# Patient Record
Sex: Female | Born: 1985 | Race: Black or African American | Hispanic: No | Marital: Married | Smoking: Never smoker
Health system: Southern US, Community
[De-identification: ages and names within clinical notes are randomized; demographics above are authoritative.]

## PROBLEM LIST (undated history)

## (undated) DIAGNOSIS — O09523 Supervision of elderly multigravida, third trimester: Secondary | ICD-10-CM

## (undated) DIAGNOSIS — Z789 Other specified health status: Secondary | ICD-10-CM

## (undated) DIAGNOSIS — J45909 Unspecified asthma, uncomplicated: Secondary | ICD-10-CM

## (undated) HISTORY — DX: Other specified health status: Z78.9

## (undated) HISTORY — PX: DILATION AND CURETTAGE OF UTERUS: SHX78

---

## 2016-03-16 ENCOUNTER — Ambulatory Visit
Admission: EM | Admit: 2016-03-16 | Discharge: 2016-03-16 | Disposition: A | Discharge status: Home / Self Care | Payer: BLUE CROSS/BLUE SHIELD

## 2016-03-16 DIAGNOSIS — N939 Abnormal uterine and vaginal bleeding, unspecified: Secondary | ICD-10-CM

## 2016-03-16 NOTE — ED Provider Notes (Signed)
Patient presents today with symptoms of spotting. Patient states that she noticed this earlier today. She is approximately [redacted] weeks pregnant. She denies any fever, chills, abdominal pain, nausea, vomiting. She does not have an appointment with her OB/GYN until early May. I discussed with the patient that there is no ability for us to do an ultrasound here in the urgent care today. She would have to go to the ER if she were to get this. Patient addresses understanding and will decide if she wants to go to the ER.  Jolene ProvostKirtida Pelagia Iacobucci, MD 03/16/16 [phone]

## 2019-12-08 ENCOUNTER — Ambulatory Visit: Payer: Commercial Managed Care - PPO | Attending: Internal Medicine

## 2019-12-08 DIAGNOSIS — Z20822 Contact with and (suspected) exposure to covid-19: Secondary | ICD-10-CM | POA: Insufficient documentation

## 2019-12-10 LAB — NOVEL CORONAVIRUS, NAA: SARS-CoV-2, NAA: NOT DETECTED

## 2019-12-26 LAB — OB RESULTS CONSOLE GC/CHLAMYDIA
Chlamydia: NEGATIVE
Gonorrhea: NEGATIVE

## 2019-12-26 LAB — OB RESULTS CONSOLE RPR: RPR: NONREACTIVE

## 2019-12-26 LAB — OB RESULTS CONSOLE HEPATITIS B SURFACE ANTIGEN: Hepatitis B Surface Ag: NEGATIVE

## 2019-12-26 LAB — OB RESULTS CONSOLE RUBELLA ANTIBODY, IGM: Rubella: IMMUNE

## 2019-12-26 LAB — OB RESULTS CONSOLE HIV ANTIBODY (ROUTINE TESTING): HIV: NONREACTIVE

## 2019-12-26 LAB — OB RESULTS CONSOLE ABO/RH: RH Type: POSITIVE

## 2019-12-26 LAB — OB RESULTS CONSOLE ANTIBODY SCREEN: Antibody Screen: NEGATIVE

## 2020-03-23 ENCOUNTER — Encounter: Payer: Self-pay | Admitting: Obstetrics and Gynecology

## 2020-03-28 ENCOUNTER — Other Ambulatory Visit (HOSPITAL_COMMUNITY): Payer: Self-pay | Admitting: Obstetrics and Gynecology

## 2020-03-28 DIAGNOSIS — Z363 Encounter for antenatal screening for malformations: Secondary | ICD-10-CM

## 2020-04-18 ENCOUNTER — Encounter: Payer: Self-pay | Admitting: *Deleted

## 2020-04-20 ENCOUNTER — Ambulatory Visit (HOSPITAL_COMMUNITY): Payer: Commercial Managed Care - PPO | Attending: Obstetrics and Gynecology

## 2020-04-20 ENCOUNTER — Other Ambulatory Visit: Payer: Self-pay | Admitting: *Deleted

## 2020-04-20 ENCOUNTER — Encounter: Payer: Self-pay | Admitting: Obstetrics and Gynecology

## 2020-04-20 ENCOUNTER — Other Ambulatory Visit: Payer: Self-pay

## 2020-04-20 ENCOUNTER — Other Ambulatory Visit (HOSPITAL_COMMUNITY): Payer: Self-pay | Admitting: Obstetrics and Gynecology

## 2020-04-20 ENCOUNTER — Ambulatory Visit: Payer: Commercial Managed Care - PPO | Admitting: *Deleted

## 2020-04-20 ENCOUNTER — Ambulatory Visit (HOSPITAL_BASED_OUTPATIENT_CLINIC_OR_DEPARTMENT_OTHER): Payer: Commercial Managed Care - PPO | Admitting: Obstetrics and Gynecology

## 2020-04-20 VITALS — BP 121/83 | HR 124 | Ht 67.0 in

## 2020-04-20 DIAGNOSIS — O0990 Supervision of high risk pregnancy, unspecified, unspecified trimester: Secondary | ICD-10-CM | POA: Diagnosis present

## 2020-04-20 DIAGNOSIS — Z363 Encounter for antenatal screening for malformations: Secondary | ICD-10-CM | POA: Insufficient documentation

## 2020-04-20 DIAGNOSIS — O403XX1 Polyhydramnios, third trimester, fetus 1: Secondary | ICD-10-CM | POA: Diagnosis present

## 2020-04-20 DIAGNOSIS — O321XX2 Maternal care for breech presentation, fetus 2: Secondary | ICD-10-CM

## 2020-04-20 DIAGNOSIS — O09523 Supervision of elderly multigravida, third trimester: Secondary | ICD-10-CM | POA: Diagnosis not present

## 2020-04-20 DIAGNOSIS — E669 Obesity, unspecified: Secondary | ICD-10-CM

## 2020-04-20 DIAGNOSIS — Z3A28 28 weeks gestation of pregnancy: Secondary | ICD-10-CM

## 2020-04-20 DIAGNOSIS — O30043 Twin pregnancy, dichorionic/diamniotic, third trimester: Secondary | ICD-10-CM

## 2020-04-20 DIAGNOSIS — O34219 Maternal care for unspecified type scar from previous cesarean delivery: Secondary | ICD-10-CM

## 2020-04-20 DIAGNOSIS — O365932 Maternal care for other known or suspected poor fetal growth, third trimester, fetus 2: Secondary | ICD-10-CM | POA: Insufficient documentation

## 2020-04-20 DIAGNOSIS — O30049 Twin pregnancy, dichorionic/diamniotic, unspecified trimester: Secondary | ICD-10-CM

## 2020-04-20 DIAGNOSIS — O321XX0 Maternal care for breech presentation, not applicable or unspecified: Secondary | ICD-10-CM | POA: Diagnosis not present

## 2020-04-20 NOTE — Progress Notes (Signed)
Maternal-Fetal Medicine   Name: Joanne Merritt DOB: Apr 25, 1986 MRN: [id number] Referring Provider: Doristine Counter, MD   I had the pleasure of seeing [facility] Merr[facility] at [facility]. She is a G5 P1031 with dichorionic-diamniotic twin pregnancy at 28w 3d gestation and on ultrasound performed at your office selective fetal growth restriction of twin B was detected.  Her prenatal care has been, otherwise, uneventful.  She is yet to undergo screening for gestational diabetes.  Patient reports she had midtrimester fetal anatomy scan that was reported as normal.  She had opted not to screen for fetal aneuploidies. Obstetric history significant for a term cesarean delivery (nonreassuring fetal heart trace) in 2018 at [facility]  She delivered a female infant weighing 8 pounds at birth and her son is in good health.  She also had early miscarriages. GYN history: No history of abnormal Pap smears or cervical surgeries. No history of breast disease.  Her previous cycles were reportedly regular. Past medical history: Childhood asthma.  No history of diabetes or hypertension or sickle cell trait. Past surgical history: D&C, cesarean section. Medications: Prenatal vitamins, vitamin D. Allergies: Penicillin (hives). Social history: Denies tobacco drug or alcohol use.  Her partner is African-American and he is in good health he is also the father of her first child. Family history: No history of venous thromboembolism in the family.  On ultrasound, we confirmed dichorionic-diamniotic twin pregnancy. Twin A: Maternal left, cephalic presentation, anterior placenta, female fetus.  Mild polyhydramnios is seen (maximum vertical pocket measuring 10.4 cm).  Fetal growth is appropriate for gestational age.  The estimated fetal weight is at the 31st percentile.  Fetal anatomical survey appears normal but limited by advanced gestational age.  Umbilical artery Doppler showed slightly increased  S/D ratio.  Twin B: Maternal right, breech presentation, anterio lateral placenta, female fetus.  Amniotic fluid is normal and good fetal activity seen.  The estimated fetal weight is at less than the 1st percentile.  Fetal anatomical survey appears normal but limited by advanced gestational age.  Umbilical artery Doppler showed normal forward diastolic flow.  Growth discordance: 40% (abnormal).  I counseled the patient on the following: Dichorionic-diamniotic twin pregnancy with selective fetal growth restriction (twin B) -Explained the chorionicity with diagrams and the usually favorable outcome of one or both twins and dichorionic twin gestation. -Patient has selective severe fetal growth restriction of twin B. -Discussed the possible causes of fetal growth restriction including placental insufficiency (most-common cause), fetal infections (unlikely), fetal chromosomal anomalies or genetic conditions. -Discussed the significance and frequency of antenatal testing to detect fetal compromise and plan delivery.  Weekly antenatal testing is recommended from now till delivery. -I informed her that only amniocentesis will be able to determine the fetal karyotype or genetic conditions Doctor, general practice).  I explained amniocentesis procedure and possible complications including preterm delivery or PPROM in 1 and 500 procedures.  Patient opted not to have amniocentesis. -Our goal is to prolong the pregnancy with frequent antenatal testing. -Timing of delivery would be based on interval growth and antenatal testing. -If good interval growth is seen in twin B we would recommend delivery at [redacted] weeks gestation provided antenatal testing remains reassuring.  However, if poor interval growth is seen or if antenatal testing is abnormal earlier delivery would be recommended. -Patient understands that preterm delivery of twins for growth restriction in one twin places the other healthy twin at risk of prematurity  complications. -I informed her fetal growth restriction may proceed  development of preeclampsia in some cases.  Blood pressure today at our office was 121/83 mmHg. -I discussed the role of antenatal corticosteroids for fetal pulmonary maturity that are preferably given when delivery is anticipated.  Recommendations: -Weekly umbilical artery Doppler to continue. -NST with Doppler studies -Fetal growth assessment in 3 weeks. -BPP from 31 to [redacted] weeks gestation. -Delivery at [redacted] weeks gestation or earlier as indicated. -Gestational diabetes screening now as antenatal corticosteroids may be indicated.  Thank you for your consultation if you have any questions or concerns please do not hesitate to contact me at the [facility]. Consultation including face-to-face counseling: 40 minutes.

## 2020-04-25 ENCOUNTER — Other Ambulatory Visit: Payer: Commercial Managed Care - PPO

## 2020-04-26 ENCOUNTER — Other Ambulatory Visit: Payer: Commercial Managed Care - PPO

## 2020-04-27 ENCOUNTER — Ambulatory Visit: Payer: Commercial Managed Care - PPO | Attending: Obstetrics and Gynecology

## 2020-04-27 ENCOUNTER — Ambulatory Visit: Payer: Commercial Managed Care - PPO | Admitting: *Deleted

## 2020-04-27 ENCOUNTER — Other Ambulatory Visit: Payer: Self-pay

## 2020-04-27 ENCOUNTER — Encounter: Payer: Self-pay | Admitting: *Deleted

## 2020-04-27 VITALS — BP 137/86 | HR 105

## 2020-04-27 DIAGNOSIS — O30043 Twin pregnancy, dichorionic/diamniotic, third trimester: Secondary | ICD-10-CM | POA: Diagnosis not present

## 2020-04-27 DIAGNOSIS — O99213 Obesity complicating pregnancy, third trimester: Secondary | ICD-10-CM

## 2020-04-27 DIAGNOSIS — O30049 Twin pregnancy, dichorionic/diamniotic, unspecified trimester: Secondary | ICD-10-CM | POA: Insufficient documentation

## 2020-04-27 DIAGNOSIS — O09523 Supervision of elderly multigravida, third trimester: Secondary | ICD-10-CM | POA: Diagnosis not present

## 2020-04-27 DIAGNOSIS — O403XX1 Polyhydramnios, third trimester, fetus 1: Secondary | ICD-10-CM

## 2020-04-27 DIAGNOSIS — O365932 Maternal care for other known or suspected poor fetal growth, third trimester, fetus 2: Secondary | ICD-10-CM | POA: Diagnosis not present

## 2020-04-27 DIAGNOSIS — Z363 Encounter for antenatal screening for malformations: Secondary | ICD-10-CM

## 2020-04-27 DIAGNOSIS — Z3A29 29 weeks gestation of pregnancy: Secondary | ICD-10-CM

## 2020-04-27 DIAGNOSIS — O34219 Maternal care for unspecified type scar from previous cesarean delivery: Secondary | ICD-10-CM | POA: Diagnosis not present

## 2020-04-27 DIAGNOSIS — E669 Obesity, unspecified: Secondary | ICD-10-CM

## 2020-04-27 NOTE — Procedures (Signed)
Joanne Merritt 04-29-1986 [redacted]w[redacted]d   Fetus B Non-Stress Test Interpretation for 04/27/20  Indication: IUGR Joanne Merritt 01-Jun-1986 [redacted]w[redacted]d  Fetus A Non-Stress Test Interpretation for 04/27/20  Indication: Di/Di twins, IUGR in Twin B  Fetal Heart Rate A Mode: External Baseline Rate (A): 150 bpm Variability: Moderate Accelerations: 10 x 10 Decelerations: None Multiple birth?: Yes  Uterine Activity Mode: Palpation, Toco Contraction Frequency (min): Occas. Contraction Quality: Mild Resting Tone Palpated: Relaxed Resting Time: Adequate  Interpretation (Fetal Testing) Nonstress Test Interpretation: Reactive Overall Impression: Reassuring for gestational age Comments: Tracing reviewed by Dr. Judeth Cornfield    Fetal Heart Rate Fetus B Mode: External Baseline Rate (B): 155 BPM Variability: Moderate Accelerations: 10 x 10 Decelerations: None  Uterine Activity Mode: Palpation, Toco Contraction Frequency (min): Occas. Contraction Quality: Mild Resting Tone Palpated: Relaxed Resting Time: Adequate

## 2020-05-02 ENCOUNTER — Encounter: Payer: Self-pay | Admitting: Obstetrics and Gynecology

## 2020-05-04 ENCOUNTER — Ambulatory Visit: Payer: Commercial Managed Care - PPO | Attending: Obstetrics and Gynecology

## 2020-05-04 ENCOUNTER — Ambulatory Visit (HOSPITAL_BASED_OUTPATIENT_CLINIC_OR_DEPARTMENT_OTHER): Payer: Commercial Managed Care - PPO | Admitting: *Deleted

## 2020-05-04 ENCOUNTER — Ambulatory Visit: Payer: Commercial Managed Care - PPO | Admitting: *Deleted

## 2020-05-04 ENCOUNTER — Other Ambulatory Visit: Payer: Self-pay

## 2020-05-04 VITALS — BP 127/83 | HR 104

## 2020-05-04 DIAGNOSIS — O09523 Supervision of elderly multigravida, third trimester: Secondary | ICD-10-CM | POA: Diagnosis not present

## 2020-05-04 DIAGNOSIS — Z3A30 30 weeks gestation of pregnancy: Secondary | ICD-10-CM

## 2020-05-04 DIAGNOSIS — O30049 Twin pregnancy, dichorionic/diamniotic, unspecified trimester: Secondary | ICD-10-CM | POA: Insufficient documentation

## 2020-05-04 DIAGNOSIS — O99213 Obesity complicating pregnancy, third trimester: Secondary | ICD-10-CM

## 2020-05-04 DIAGNOSIS — O30043 Twin pregnancy, dichorionic/diamniotic, third trimester: Secondary | ICD-10-CM

## 2020-05-04 DIAGNOSIS — O365932 Maternal care for other known or suspected poor fetal growth, third trimester, fetus 2: Secondary | ICD-10-CM | POA: Diagnosis present

## 2020-05-04 DIAGNOSIS — O34219 Maternal care for unspecified type scar from previous cesarean delivery: Secondary | ICD-10-CM

## 2020-05-04 DIAGNOSIS — Z362 Encounter for other antenatal screening follow-up: Secondary | ICD-10-CM

## 2020-05-04 DIAGNOSIS — O0990 Supervision of high risk pregnancy, unspecified, unspecified trimester: Secondary | ICD-10-CM | POA: Diagnosis present

## 2020-05-04 DIAGNOSIS — O403XX1 Polyhydramnios, third trimester, fetus 1: Secondary | ICD-10-CM

## 2020-05-04 DIAGNOSIS — E669 Obesity, unspecified: Secondary | ICD-10-CM

## 2020-05-04 NOTE — Procedures (Signed)
Joanne Merritt 03/12/1986 [redacted]w[redacted]d   Fetus B Non-Stress Test Interpretation for 05/04/20  Indication: IUGR Joanne Merritt [birth year] [redacted]w[redacted]d  Fetus A Non-Stress Test Interpretation for 05/04/20  Indication: IUGR  Fetal Heart Rate A Mode: External Baseline Rate (A): 130 bpm Variability: Moderate Accelerations: 15 x 15 Decelerations: None Multiple birth?: Yes  Uterine Activity Mode: Toco Contraction Frequency (min): Occ UC noted Contraction Duration (sec): 40-60 Contraction Quality: Mild Resting Tone Palpated: Relaxed Resting Time: Adequate  Interpretation (Fetal Testing) Nonstress Test Interpretation: Reactive Comments: FHR tracing rev'd by Dr. Parke Poisson    Fetal Heart Rate Fetus B Mode: External Baseline Rate (B): 150 BPM Variability: Moderate Accelerations: 15 x 15 Decelerations: None  Uterine Activity Mode: Toco Contraction Frequency (min): Occ UC noted Contraction Duration (sec): 40-60 Contraction Quality: Mild Resting Tone Palpated: Relaxed Resting Time: Adequate  Interpretation (Baby B - Fetal Testing) Nonstress Test Interpretation (Baby B): Reactive Comments (Baby B): FHR tracing rev'd by Dr. Parke Poisson

## 2020-05-11 ENCOUNTER — Ambulatory Visit: Payer: Commercial Managed Care - PPO | Admitting: *Deleted

## 2020-05-11 ENCOUNTER — Ambulatory Visit (HOSPITAL_BASED_OUTPATIENT_CLINIC_OR_DEPARTMENT_OTHER): Payer: Commercial Managed Care - PPO

## 2020-05-11 ENCOUNTER — Other Ambulatory Visit: Payer: Self-pay

## 2020-05-11 ENCOUNTER — Inpatient Hospital Stay (HOSPITAL_COMMUNITY)
Admission: AD | Admit: 2020-05-11 | Discharge: 2020-05-11 | Disposition: A | Discharge status: Home / Self Care | Payer: Commercial Managed Care - PPO | Attending: Obstetrics and Gynecology | Admitting: Obstetrics and Gynecology

## 2020-05-11 ENCOUNTER — Encounter (HOSPITAL_COMMUNITY): Payer: Self-pay | Admitting: Obstetrics and Gynecology

## 2020-05-11 VITALS — BP 126/82 | HR 88

## 2020-05-11 DIAGNOSIS — O403XX1 Polyhydramnios, third trimester, fetus 1: Secondary | ICD-10-CM | POA: Diagnosis not present

## 2020-05-11 DIAGNOSIS — O34219 Maternal care for unspecified type scar from previous cesarean delivery: Secondary | ICD-10-CM

## 2020-05-11 DIAGNOSIS — Z3A31 31 weeks gestation of pregnancy: Secondary | ICD-10-CM

## 2020-05-11 DIAGNOSIS — O09523 Supervision of elderly multigravida, third trimester: Secondary | ICD-10-CM | POA: Diagnosis not present

## 2020-05-11 DIAGNOSIS — Z362 Encounter for other antenatal screening follow-up: Secondary | ICD-10-CM | POA: Diagnosis not present

## 2020-05-11 DIAGNOSIS — O30049 Twin pregnancy, dichorionic/diamniotic, unspecified trimester: Secondary | ICD-10-CM

## 2020-05-11 DIAGNOSIS — E669 Obesity, unspecified: Secondary | ICD-10-CM | POA: Diagnosis not present

## 2020-05-11 DIAGNOSIS — O99213 Obesity complicating pregnancy, third trimester: Secondary | ICD-10-CM | POA: Insufficient documentation

## 2020-05-11 DIAGNOSIS — O30043 Twin pregnancy, dichorionic/diamniotic, third trimester: Secondary | ICD-10-CM

## 2020-05-11 DIAGNOSIS — Z3689 Encounter for other specified antenatal screening: Secondary | ICD-10-CM

## 2020-05-11 DIAGNOSIS — O365932 Maternal care for other known or suspected poor fetal growth, third trimester, fetus 2: Secondary | ICD-10-CM

## 2020-05-11 DIAGNOSIS — O4700 False labor before 37 completed weeks of gestation, unspecified trimester: Secondary | ICD-10-CM

## 2020-05-11 DIAGNOSIS — O0990 Supervision of high risk pregnancy, unspecified, unspecified trimester: Secondary | ICD-10-CM

## 2020-05-11 DIAGNOSIS — O4703 False labor before 37 completed weeks of gestation, third trimester: Secondary | ICD-10-CM | POA: Diagnosis not present

## 2020-05-11 LAB — WET PREP, GENITAL
Clue Cells Wet Prep HPF POC: NONE SEEN
Sperm: NONE SEEN
Trich, Wet Prep: NONE SEEN
Yeast Wet Prep HPF POC: NONE SEEN

## 2020-05-11 MED ORDER — LACTATED RINGERS IV BOLUS
1000.0000 mL | Freq: Once | INTRAVENOUS | Status: AC
  Administered 2020-05-11: 1000 mL via INTRAVENOUS

## 2020-05-11 NOTE — MAU Note (Signed)
.   Joanne Merritt is a 34 y.o. at [redacted]w[redacted]d here in MAU reporting: NST today at the office and baby B (girl) had a decel on the monitor and she was sent to MAU for further evaluation. She states that baby recovered after being turned onto her left side. No ctx, VB, or LOF. Endorses good fetal movement for baby A and B.   Pain score: 0 Vitals:   05/11/20 1603  BP: 126/79  Pulse: (!) 102  Resp: 17  Temp: 98.5 F (36.9 C)  SpO2: 96%     FHT: A: 139          B: 156

## 2020-05-11 NOTE — Procedures (Signed)
Joanne Merritt 31-May-1986 [redacted]w[redacted]d  Fetus A Non-Stress Test Interpretation for 05/11/20  Indication: IUGR  Joanne Merritt 1986-01-16 [redacted]w[redacted]d   Fetus B Non-Stress Test Interpretation for 05/11/20  Indication: IUGR  Fetal Heart Rate Fetus B Mode: External Baseline Rate (B): 135 BPM Variability: Moderate Accelerations: 15 x 15 Decelerations: Late (FHR tracing down to 100 for 3 min with return to baseline of 145 with moderate variability noted. )  Uterine Activity Mode: Toco Contraction Frequency (min): Occ UC noted Contraction Duration (sec): 60 Contraction Quality: Mild Resting Tone Palpated: Relaxed Resting Time: Adequate  Interpretation (Baby B - Fetal Testing) Nonstress Test Interpretation (Baby B): Non-reactive Comments (Baby B): FHR tracing rev'd by Dr. Grace Bushy. pt to MAU for further fetal monitoring.    Fetal Heart Rate A Mode: External Baseline Rate (A): 135 bpm Variability: Moderate Accelerations: 15 x 15 Decelerations: Late (FHR down to 90's lasting 4 min. return to baseline of 155 with good variablilty. ) Multiple birth?: Yes  Uterine Activity Mode: Toco Contraction Frequency (min): Occ UC noted Contraction Duration (sec): 60 Contraction Quality: Mild Resting Tone Palpated: Relaxed Resting Time: Adequate  Interpretation (Fetal Testing) Nonstress Test Interpretation: Non-reactive Comments: FHR tracing rev'd by Dr. Grace Bushy. Pt to go to MAU for further fetal monitoring

## 2020-05-11 NOTE — MAU Provider Note (Signed)
History     CSN: [id number]  Arrival date and time: 05/11/20 1559   First Provider Initiated Contact with Patient 05/11/20 1713      Chief Complaint  Patient presents with  . sent from MFM for monitoring   Ms. Joanne Merritt is a 34 y.o. G5P1031 at [redacted]w[redacted]d who presents to MAU for prolonged monitoring. RN report from MFM shows Di-Di Twins both with prolonged, late decelerations on NST tracing at MFM. Twin A with BPP 6/8, Twin B with BPP 8/8, Twin B with growth discordance 37%. Per conversation with Dr. Reina Fuse, will monitor for 2 hours on NST and if normal, will be discharged home.  Pt denies VB, LOF, ctx, decreased FM, vaginal discharge/odor/itching. Pt denies N/V, abdominal pain, constipation, diarrhea, or urinary problems. Pt denies fever, chills, fatigue, sweating or changes in appetite. Pt denies SOB or chest pain. Pt denies dizziness, HA, light-headedness, weakness.   OB History    Gravida  5   Para  1   Term  1   Preterm      AB  3   Living  1     SAB  2   TAB      Ectopic  1   Multiple      Live Births              Past Medical History:  Diagnosis Date  . Medical history non-contributory     Past Surgical History:  Procedure Laterality Date  . CESAREAN SECTION      History reviewed. No pertinent family history.  Social History   Tobacco Use  . Smoking status: Never Smoker  . Smokeless tobacco: Never Used  Vaping Use  . Vaping Use: Never used  Substance Use Topics  . Alcohol use: Never  . Drug use: Never    Allergies:  Allergies  Allergen Reactions  . Penicillins     Medications Prior to Admission  Medication Sig Dispense Refill Last Dose  . Cholecalciferol (VITAMIN D3 PO) Take by mouth.   05/11/2020 at Unknown time  . Prenatal Vit-Fe Fumarate-FA (PRENATAL VITAMIN PO) Take by mouth.   05/11/2020 at Unknown time  . progesterone 200 MG SUPP Place 200 mg vaginally at bedtime. (Patient not taking: Reported on 05/11/2020)        Review of Systems  Constitutional: Negative for chills, diaphoresis, fatigue and fever.  Eyes: Negative for visual disturbance.  Respiratory: Negative for shortness of breath.   Cardiovascular: Negative for chest pain.  Gastrointestinal: Negative for abdominal pain, constipation, diarrhea, nausea and vomiting.  Genitourinary: Negative for dysuria, flank pain, frequency, pelvic pain, urgency, vaginal bleeding and vaginal discharge.  Neurological: Negative for dizziness, weakness, light-headedness and headaches.   Physical Exam   Blood pressure 122/74, pulse 98, temperature 98.5 F (36.9 C), temperature source Oral, resp. rate 16, height 5\' 7"  (1.702 m), weight 96.7 kg, last menstrual period 10/04/2019, SpO2 96 %.  Patient Vitals for the past 24 hrs:  BP Temp Temp src Pulse Resp SpO2 Height Weight  05/11/20 2114 122/74 -- -- 98 16 -- -- --  05/11/20 1603 126/79 98.5 F (36.9 C) Oral (!) 102 17 96 % 5\' 7"  (1.702 m) 96.7 kg   Physical Exam  Constitutional:  Non-toxic appearance. She does not appear ill. No distress.  HENT:  Head: Normocephalic and atraumatic.  Respiratory: Effort normal.  GI: Soft. Normal appearance.  Genitourinary: There is no rash, tenderness or lesion on the right labia. There is no rash, tenderness or  lesion on the left labia.    Genitourinary Comments: CE: long/closed/posterior   Neurological: She is alert.  Skin: She is not diaphoretic.  Psychiatric: Her behavior is normal. Mood, judgment and thought content normal.   Results for orders placed or performed during the hospital encounter of 05/11/20 (from the past 24 hour(s))  Wet prep, genital     Status: Abnormal   Collection Time: 05/11/20  7:10 PM  Result Value Ref Range   Yeast Wet Prep HPF POC NONE SEEN NONE SEEN   Trich, Wet Prep NONE SEEN NONE SEEN   Clue Cells Wet Prep HPF POC NONE SEEN NONE SEEN   WBC, Wet Prep HPF POC FEW (A) NONE SEEN   Sperm NONE SEEN     [place] MFM FETAL BPP W/NONSTRESS  ADD'L GEST  Result Date: 05/11/2020 ----------------------------------------------------------------------  OBSTETRICS REPORT                         (Signed Final 05/11/2020 03:59 pm) ---------------------------------------------------------------------- Patient Info  ID #:        [id number]                          D.O.B.:  12/10/1985 (34 yrs)  Name:        Joanne Merritt                 Visit Date: 05/11/2020 12:53 pm ---------------------------------------------------------------------- Performed By  Attending:         Lin Landsman      Ref. Address:      [address]                     [place]                                        [phone]  Performed By:      Tommie Raymond BS,       Location:          [facility]                     [facility], RVT                                 Fetal Care  Referred By:       Yehuda Mao MD ---------------------------------------------------------------------- Orders  #   Description                          Code         Ordered By  1   [place] MFM UA CORD DOPPLER               76820.02     RAVI SHANKAR  2   [place] MFM FETAL BPP W/NONSTRESS         52841.3      RAVI SHANKAR  3   [place] MFM OB FOLLOW UP                  24401.02     RAVI SHANKAR  4   [place] MFM OB  FOLLOW UP ADDL             16109.60     RAVI SHANKAR      GEST  5   [place] MFM FETAL BPP W/NONSTRESS         45409.8      RAVI SHANKAR      ADD'L GEST  6   [place] MFM UA DOPPLER ADDL GEST          76820.03     RAVI SHANKAR      RE EVAL ----------------------------------------------------------------------  #   Order #                    Accession #                 Episode #  1   [id number]                  [id number]  2   [id number]                  [id number]                  [id number]  3   [id number]                  [id number]                  [id number]  4   [id number]                  [id number]                  [id number]  5   [id number]                  [id number]                   [id number]  6   [id number]                  [id number]                  [id number] ---------------------------------------------------------------------- Indications  [redacted] weeks gestation of pregnancy                 Z3A.31  Maternal care for known or suspected poor       O36.5932  fetal growth, third trimester, fetus 2 IUGR  Twin pregnancy, di/di, third trimester          O30.043  Encounter for other antenatal screening         Z36.2  follow-up  Advanced maternal age multigravida 42+, third   O61.523  trimester  Polyhydramnios, third trimester, antepartum     O40.3XX1  condition or complication, fetus 1  Obesity complicating pregnancy, third trimester O99.213  Previous cesarean delivery, antepartum x 1      O34.219  Declined genetic testing ---------------------------------------------------------------------- Fetal Evaluation (Fetus A)  Num Of Fetuses:          2  Fetal Heart Rate(bpm):   143  Cardiac Activity:        Observed  Fetal Lie:               Maternal left side  Presentation:            Cephalic  Placenta:  Anterior Fundal  P. Cord Insertion:       Previously Visualized  Membrane Desc:       Dividing Membrane seen - Dichorionic.  Amniotic Fluid  AFI FV:      Subjectively upper-normal                              Largest Pocket(cm)                              6.6 ---------------------------------------------------------------------- Biophysical Evaluation (Fetus A)  Amniotic F.V:   Pocket => 2 cm              F. Tone:         Observed  F. Movement:    Observed                    Score:           6/8  F. Breathing:   Not Observed ---------------------------------------------------------------------- Biometry (Fetus A)  BPD:      78.4   mm     G. Age:  31w 3d         41  %    CI:         82.66  %    70 - 86                                                           FL/HC:       21.0  %    19.3 - 21.3  HC:        272   mm     G. Age:  29w 5d        < 1  %    HC/AC:       1.00       0.96 - 1.17  AC:       272.1   mm     G. Age:  31w 2d         43  %    FL/BPD:      73.0  %    71 - 87  FL:       57.2   mm     G. Age:  30w 0d          8  %    FL/AC:       21.0  %    20 - 24  CER:      42.4   mm     G. Age:  36w 3d       > 95  %  LV:         3.9  mm  CM:         5.8  mm  Est. FW:    1629   gm      3 lb 9 oz     19  %     FW Discordancy     0 \ 37  % ---------------------------------------------------------------------- OB History  Gravidity:     5         Term:  1          Prem:  0  SAB:   2  TOP:           0       Ectopic: 1         Living: 1 ---------------------------------------------------------------------- Gestational Age (Fetus A)  LMP:            31w 3d       Date:  10/04/19                   EDD:  07/10/20  U/S Today:      30w 4d                                         EDD:  07/16/20  Best:           31w 3d    Det. By:  LMP  (10/04/19)            EDD:  07/10/20 ---------------------------------------------------------------------- Anatomy (Fetus A)  Cranium:                Appears normal         LVOT:                   Not well visualized  Cavum:                  Appears normal         Aortic Arch:            Not well visualized  Ventricles:             Appears normal         Ductal Arch:            Not well visualized  Choroid Plexus:         Previously seen        Diaphragm:              Appears normal  Cerebellum:             Appears normal         Stomach:                Appears normal, left                                                                         sided  Posterior Fossa:        Not well visualized    Abdomen:                Appears normal  Nuchal Fold:            Not applicable (>20    Abdominal Wall:         Appears nml (cord                          wks GA)                                        insert, abd  wall)  Face:                   Appears normal         Cord Vessels:           Appears normal (3                          (orbits and profile)                           vessel  cord)  Lips:                   Appears normal         Kidneys:                Appear normal  Palate:                 Not well visualized    Bladder:                Appears normal  Thoracic:               Appears normal         Spine:                  Ltd views no                                                                         intracranial signs of                                                                         NTD  Heart:                  Appears normal         Upper Extremities:      Visualized                          (4CH, axis, and situs)  RVOT:                   Not well visualized    Lower Extremities:      Visualized  Other:   Fetus appears to be a female. Technically difficult due to advanced           gestational age. ---------------------------------------------------------------------- Doppler - Fetal Vessels (Fetus A)  Umbilical Artery    S/D     %tile      RI                                      ADFV    RDFV   4.13        97   0.76  No      No ---------------------------------------------------------------------- Fetal Evaluation (Fetus B)  Num Of Fetuses:          2  Fetal Heart Rate(bpm):   136  Cardiac Activity:        Observed  Fetal Lie:               Maternal right side  Presentation:            Oblique/ Trv, head to maternal right  Placenta:                Anterior  P. Cord Insertion:       Previously Visualized  Membrane Desc:       Dividing Membrane seen - Dichorionic.  Amniotic Fluid  AFI FV:      Polyhydramnios                              Largest Pocket(cm)                              9.1 ---------------------------------------------------------------------- Biophysical Evaluation (Fetus B)  Amniotic F.V:   Pocket => 2 cm              F. Tone:         Observed  F. Movement:    Observed                    Score:           8/8  F. Breathing:   Observed ---------------------------------------------------------------------- Biometry (Fetus  B)  BPD:      77.9   mm     G. Age:  31w 2d         34  %    CI:         85.71  %    70 - 86                                                           FL/HC:       18.1  %    19.3 - 21.3  HC:        265   mm     G. Age:  28w 6d        < 1  %    HC/AC:       1.18       0.96 - 1.17  AC:      224.9   mm     G. Age:  26w 6d        < 1  %    FL/BPD:      61.6  %    71 - 87  FL:         48   mm     G. Age:  26w 1d        < 1  %    FL/AC:       21.3  %    20 - 24  CER:      37.5   mm     G. Age:  32w 2d         61  %  LV:         8.9  mm  CM:         5.9  mm  Est. FW:    1019   gm      2 lb 4 oz    < 1  %     FW Discordancy        37   % ---------------------------------------------------------------------- Gestational Age (Fetus B)  LMP:            31w 3d       Date:  10/04/19                   EDD:  07/10/20  U/S Today:      28w 2d                                         EDD:  08/01/20  Best:           31w 3d    Det. By:  LMP  (10/04/19)            EDD:  07/10/20 ---------------------------------------------------------------------- Anatomy (Fetus B)  Cranium:                Appears normal         LVOT:                   Not well visualized  Cavum:                  Appears normal         Aortic Arch:            Not well visualized  Ventricles:             Appears normal         Ductal Arch:            Not well visualized  Choroid Plexus:         Appears normal         Diaphragm:              Appears normal  Cerebellum:             Appears normal         Stomach:                Appears normal, left                                                                         sided  Posterior Fossa:        Not well visualized    Abdomen:                Appears normal  Nuchal Fold:            Not applicable (>20    Abdominal Wall:         Appears nml (cord                          wks GA)  insert, abd wall)  Face:                   Appears normal         Cord Vessels:           Previously  seen                          (orbits and profile)  Lips:                   Appears normal         Kidneys:                Appear normal  Palate:                 Not well visualized    Bladder:                Appears normal  Thoracic:               Appears normal         Spine:                  Limited views                                                                         previously seen  Heart:                  Appears normal         Upper Extremities:      Visualized                          (4CH, axis, and situs)  RVOT:                   Not well visualized    Lower Extremities:      Visualized  Other:   Fetus appears to be female. Technically difficult due to advanced GA           and fetal position. ---------------------------------------------------------------------- Doppler - Fetal Vessels (Fetus B)  Umbilical Artery    S/D     %tile                                              ADFV    RDFV   3.17        75                                                 No      No ---------------------------------------------------------------------- Cervix Uterus Adnexa  Cervix  Not visualized (advanced GA >24wks)  Uterus  No abnormality visualized.  Right Ovary  Within normal limits. No adnexal mass visualized.  Left Ovary  Within normal limits. No adnexal mass visualized.  Cul De Sac  No free fluid seen.  Adnexa  No abnormality visualized. ---------------------------------------------------------------------- Impression  Follow up diamniotic dichorionic twin growth with IUGR of Twin  B.  There is positive interval growth however, interval growth of  Twin B significantly lags Twin with current discordance at 37%.  Twin A BPP 6/8 with elevated UA Dopplers, normal amniotic fluid  and movement, NST reactive  Twin B BPP 8/8 with normal UA Dopplers, normal amniotic fluid  and movement, NST reactive with a prolonged deceleration  Suboptimal views of the fetal anatomy was again observed in  both Twins.  Ms. Hesser  conveyed that she feels good fetal movement from  both Twins daily.  I discussed today's visit with Ms. Joanne Merritt and reviewed  today's findings of Twin B with a prolonged deceleration during  40 minutes of monitoring. I recommended further evaluation and  monitoring at the MAU.  I spoke to Dr. Mindi Slicker who referred me to her colleague on call. I  left them a message. ---------------------------------------------------------------------- Recommendations  To MAU for further monitoring  Continue weekly testing with UA Dopplers and NST's given  abnormal Dopplers of Twin A and fetal growth discordance.  Consider delivery between 35-37 weeks given twin pregnancy  abnormal Dopplers and discordance. ( she is scheduled for 36  weeks for 06/13/20 ----------------------------------------------------------------------               Lin Landsman, MD Electronically Signed Final Report   05/11/2020 03:59 pm ----------------------------------------------------------------------  [place] MFM FETAL BPP W/NONSTRESS  Result Date: 05/11/2020 ----------------------------------------------------------------------  OBSTETRICS REPORT                         (Signed Final 05/11/2020 03:59 pm) ---------------------------------------------------------------------- Patient Info  ID #:        [id number]                          D.O.B.:  13-Mar-1986 (34 yrs)  Name:        Joanne Merritt                 Visit Date: 05/11/2020 12:53 pm ---------------------------------------------------------------------- Performed By  Attending:         Lin Landsman      Ref. Address:      [address]                     [place]                                        [phone]  Performed By:      Tommie Raymond BS,       Location:          [facility]                     RDMS, RVT                                 Fetal Care  Referred By:       Yehuda Mao MD ---------------------------------------------------------------------- Orders  #    Description  Code         Ordered By  1   [place] MFM UA CORD DOPPLER               76820.02     RAVI SHANKAR  2   [place] MFM FETAL BPP W/NONSTRESS         16109.6      RAVI SHANKAR  3   [place] MFM OB FOLLOW UP                  76816.01     RAVI SHANKAR  4   [place] MFM OB FOLLOW UP ADDL             04540.98     RAVI SHANKAR      GEST  5   [place] MFM FETAL BPP W/NONSTRESS         11914.7      RAVI SHANKAR      ADD'L GEST  6   [place] MFM UA DOPPLER ADDL GEST          76820.03     RAVI SHANKAR      RE EVAL ----------------------------------------------------------------------  #   Order #                    Accession #                 Episode #  1   [id number]                  [id number]                  [id number]  2   [id number]                  [id number]                  [id number]  3   [id number]                  [id number]                  [id number]  4   [id number]                  [id number]                  [id number]  5   [id number]                  [id number]                  [id number]  6   [id number]                  [id number]                  [id number] ---------------------------------------------------------------------- Indications  [redacted] weeks gestation of pregnancy                 Z3A.31  Maternal care for known or suspected poor       O36.5932  fetal growth, third trimester, fetus 2 IUGR  Twin pregnancy, di/di, third trimester          O30.043  Encounter for other antenatal screening         Z36.2  follow-up  Advanced maternal age multigravida 35+, third   O43.523  trimester  Polyhydramnios, third trimester, antepartum     O40.3XX1  condition or complication, fetus 1  Obesity complicating pregnancy, third trimester O99.213  Previous cesarean delivery, antepartum x 1      O34.219  Declined genetic testing ---------------------------------------------------------------------- Fetal Evaluation (Fetus A)  Num Of Fetuses:          2  Fetal Heart Rate(bpm):   143  Cardiac Activity:        Observed  Fetal Lie:                Maternal left side  Presentation:            Cephalic  Placenta:                Anterior Fundal  P. Cord Insertion:       Previously Visualized  Membrane Desc:       Dividing Membrane seen - Dichorionic.  Amniotic Fluid  AFI FV:      Subjectively upper-normal                              Largest Pocket(cm)                              6.6 ---------------------------------------------------------------------- Biophysical Evaluation (Fetus A)  Amniotic F.V:   Pocket => 2 cm              F. Tone:         Observed  F. Movement:    Observed                    Score:           6/8  F. Breathing:   Not Observed ---------------------------------------------------------------------- Biometry (Fetus A)  BPD:      78.4   mm     G. Age:  31w 3d         41  %    CI:         82.66  %    70 - 86                                                           FL/HC:       21.0  %    19.3 - 21.3  HC:        272   mm     G. Age:  29w 5d        < 1  %    HC/AC:       1.00       0.96 - 1.17  AC:      272.1   mm     G. Age:  31w 2d         43  %    FL/BPD:      73.0  %    71 - 87  FL:       57.2   mm     G. Age:  30w 0d          8  %    FL/AC:       21.0  %    20 - 24  CER:      42.4   mm     G. Age:  36w  3d       > 95  %  LV:         3.9  mm  CM:         5.8  mm  Est. FW:    1629   gm      3 lb 9 oz     19  %     FW Discordancy     0 \ 37  % ---------------------------------------------------------------------- OB History  Gravidity:     5         Term:  1          Prem:  0        SAB:   2  TOP:           0       Ectopic: 1         Living: 1 ---------------------------------------------------------------------- Gestational Age (Fetus A)  LMP:            31w 3d       Date:  10/04/19                   EDD:  07/10/20  U/S Today:      30w 4d                                         EDD:  07/16/20  Best:           31w 3d    Det. By:  LMP  (10/04/19)            EDD:  07/10/20  ---------------------------------------------------------------------- Anatomy (Fetus A)  Cranium:                Appears normal         LVOT:                   Not well visualized  Cavum:                  Appears normal         Aortic Arch:            Not well visualized  Ventricles:             Appears normal         Ductal Arch:            Not well visualized  Choroid Plexus:         Previously seen        Diaphragm:              Appears normal  Cerebellum:             Appears normal         Stomach:                Appears normal, left                                                                         sided  Posterior Fossa:        Not well visualized  Abdomen:                Appears normal  Nuchal Fold:            Not applicable (>20    Abdominal Wall:         Appears nml (cord                          wks GA)                                        insert, abd wall)  Face:                   Appears normal         Cord Vessels:           Appears normal (3                          (orbits and profile)                           vessel cord)  Lips:                   Appears normal         Kidneys:                Appear normal  Palate:                 Not well visualized    Bladder:                Appears normal  Thoracic:               Appears normal         Spine:                  Ltd views no                                                                         intracranial signs of                                                                         NTD  Heart:                  Appears normal         Upper Extremities:      Visualized                          (4CH, axis, and situs)  RVOT:                   Not well visualized    Lower Extremities:  Visualized  Other:   Fetus appears to be a female. Technically difficult due to advanced           gestational age. ---------------------------------------------------------------------- Doppler - Fetal Vessels (Fetus A)  Umbilical Artery    S/D     %tile       RI                                      ADFV    RDFV   4.13        97   0.76                                          No      No ---------------------------------------------------------------------- Fetal Evaluation (Fetus B)  Num Of Fetuses:          2  Fetal Heart Rate(bpm):   136  Cardiac Activity:        Observed  Fetal Lie:               Maternal right side  Presentation:            Oblique/ Trv, head to maternal right  Placenta:                Anterior  P. Cord Insertion:       Previously Visualized  Membrane Desc:       Dividing Membrane seen - Dichorionic.  Amniotic Fluid  AFI FV:      Polyhydramnios                              Largest Pocket(cm)                              9.1 ---------------------------------------------------------------------- Biophysical Evaluation (Fetus B)  Amniotic F.V:   Pocket => 2 cm              F. Tone:         Observed  F. Movement:    Observed                    Score:           8/8  F. Breathing:   Observed ---------------------------------------------------------------------- Biometry (Fetus B)  BPD:      77.9   mm     G. Age:  31w 2d         34  %    CI:         85.71  %    70 - 86                                                           FL/HC:       18.1  %    19.3 - 21.3  HC:        265   mm     G. Age:  28w 6d        < 1  %    HC/AC:  1.18       0.96 - 1.17  AC:      224.9   mm     G. Age:  26w 6d        < 1  %    FL/BPD:      61.6  %    71 - 87  FL:         48   mm     G. Age:  26w 1d        < 1  %    FL/AC:       21.3  %    20 - 24  CER:      37.5   mm     G. Age:  32w 2d         61  %  LV:         8.9  mm  CM:         5.9  mm  Est. FW:    1019   gm      2 lb 4 oz    < 1  %     FW Discordancy        37   % ---------------------------------------------------------------------- Gestational Age (Fetus B)  LMP:            31w 3d       Date:  10/04/19                   EDD:  07/10/20  U/S Today:      28w 2d                                         EDD:   08/01/20  Best:           31w 3d    Det. By:  LMP  (10/04/19)            EDD:  07/10/20 ---------------------------------------------------------------------- Anatomy (Fetus B)  Cranium:                Appears normal         LVOT:                   Not well visualized  Cavum:                  Appears normal         Aortic Arch:            Not well visualized  Ventricles:             Appears normal         Ductal Arch:            Not well visualized  Choroid Plexus:         Appears normal         Diaphragm:              Appears normal  Cerebellum:             Appears normal         Stomach:                Appears normal, left  sided  Posterior Fossa:        Not well visualized    Abdomen:                Appears normal  Nuchal Fold:            Not applicable (>20    Abdominal Wall:         Appears nml (cord                          wks GA)                                        insert, abd wall)  Face:                   Appears normal         Cord Vessels:           Previously seen                          (orbits and profile)  Lips:                   Appears normal         Kidneys:                Appear normal  Palate:                 Not well visualized    Bladder:                Appears normal  Thoracic:               Appears normal         Spine:                  Limited views                                                                         previously seen  Heart:                  Appears normal         Upper Extremities:      Visualized                          (4CH, axis, and situs)  RVOT:                   Not well visualized    Lower Extremities:      Visualized  Other:   Fetus appears to be female. Technically difficult due to advanced GA           and fetal position. ---------------------------------------------------------------------- Doppler - Fetal Vessels (Fetus B)  Umbilical Artery    S/D     %tile  ADFV    RDFV   3.17        75                                                 No      No ---------------------------------------------------------------------- Cervix Uterus Adnexa  Cervix  Not visualized (advanced GA >24wks)  Uterus  No abnormality visualized.  Right Ovary  Within normal limits. No adnexal mass visualized.  Left Ovary  Within normal limits. No adnexal mass visualized.  Cul De Sac  No free fluid seen.  Adnexa  No abnormality visualized. ---------------------------------------------------------------------- Impression  Follow up diamniotic dichorionic twin growth with IUGR of Twin  B.  There is positive interval growth however, interval growth of  Twin B significantly lags Twin with current discordance at 37%.  Twin A BPP 6/8 with elevated UA Dopplers, normal amniotic fluid  and movement, NST reactive  Twin B BPP 8/8 with normal UA Dopplers, normal amniotic fluid  and movement, NST reactive with a prolonged deceleration  Suboptimal views of the fetal anatomy was again observed in  both Twins.  Ms. Morlock conveyed that she feels good fetal movement from  both Twins daily.  I discussed today's visit with Ms. Joanne Merritt and reviewed  today's findings of Twin B with a prolonged deceleration during  40 minutes of monitoring. I recommended further evaluation and  monitoring at the [facility]U.  I spoke to Dr. Mindi Slicker who referred me to her colleague on call. I  left them a message. ---------------------------------------------------------------------- Recommendations  To MAU for further monitoring  Continue weekly testing with UA Dopplers and NST's given  abnormal Dopplers of Twin A and fetal growth discordance.  Consider delivery between 35-37 weeks given twin pregnancy  abnormal Dopplers and discordance. ( she is scheduled for 36  weeks for 06/13/20 ----------------------------------------------------------------------               Lin Landsman, MD Electronically Signed Final Report   05/11/2020  03:59 pm ----------------------------------------------------------------------  [place] MFM OB DETAIL +14 WK  Result Date: 04/20/2020 ----------------------------------------------------------------------  OBSTETRICS REPORT                       (Signed Final 04/20/2020 06:03 pm) ---------------------------------------------------------------------- Patient Info  ID #:       [id number]                          D.O.B.:  1986-02-07 (34 yrs)  Name:       Joanne Merritt                 Visit Date: 04/20/2020 10:18 am ---------------------------------------------------------------------- Performed By  Attending:        Noralee Space MD        Ref. Address:     [address]                                                             [address]  Performed By:     Percell Boston          [facility]  Referred By:      Yehuda Mao MD ---------------------------------------------------------------------- Orders  #  Description                           Code        Ordered By  1  [place] MFM OB DETAIL +14 WK               76811.01    CECILIA BANGA  2  [place] MFM OB DETAIL ADDL GEST            76811.02    CECILIA BANGA     +14 WK  3  [place] MFM UA CORD DOPPLER                76820.02    CECILIA BANGA  4  [place] MFM UA ADDL GEST                   76820.01    CECILIA BANGA ----------------------------------------------------------------------  #  Order #                     Accession #                Episode #  1  [id number]                   [id number]                 [id number]  2  [id number]                   [id number]                 [id number]  3  [id number]                   [id number]                 [id number]  4  [id number]                   [id number]                 [id number] ---------------------------------------------------------------------- Indications  Maternal care for known or suspected poor      O36.5932  fetal  growth, third trimester, fetus 2 IUGR  [redacted] weeks gestation of pregnancy                Z3A.28  Encounter for antenatal screening for          Z36.3  malformations  Twin pregnancy, di/di, third trimester         O46.043  Advanced maternal age multigravida 2+,        O74.523  third trimester  Polyhydramnios, third trimester, antepartum    O40.3XX1  condition or complication, fetus 1  Obesity complicating pregnancy, third          O99.213  trimester  Previous cesarean delivery, antepartum x 1  O34.219  Declined genetic testing ---------------------------------------------------------------------- Fetal Evaluation (Fetus A)  Num Of Fetuses:         2  Fetal Heart Rate(bpm):  132  Cardiac Activity:       Observed  Fetal Lie:              Maternal left side  Presentation:           Cephalic  Placenta:               Anterior  P. Cord Insertion:      Visualized, central  Membrane Desc:      Dividing Membrane seen - Dichorionic.  Amniotic Fluid  AFI FV:      Polyhydramnios                              Largest Pocket(cm)                              10.4 ---------------------------------------------------------------------- Biometry (Fetus A)  BPD:      73.8  mm     G. Age:  29w 4d         75  %    CI:        84.18   %    70 - 86                                                          FL/HC:      20.2   %    18.8 - 20.6  HC:      253.5  mm     G. Age:  27w 4d        4.8  %    HC/AC:      1.03        1.05 - 1.21  AC:      245.4  mm     G. Age:  28w 6d         54  %    FL/BPD:     69.4   %    71 - 87  FL:       51.2  mm     G. Age:  27w 3d         11  %    FL/AC:      20.9   %    20 - 24  HUM:      48.1  mm     G. Age:  28w 1d         41  %  Est. FW:    1199  gm    2 lb 10 oz      31  %     FW Discordancy     0 \ 40 % ---------------------------------------------------------------------- OB History  Gravidity:    5         Term:   1        Prem:   0        SAB:   2  TOP:          0       Ectopic:  1        Living:  1  ---------------------------------------------------------------------- Gestational Age (Fetus A)  LMP:           28w 3d        Date:  10/04/19                 EDD:   07/10/20  U/S Today:     28w 3d                                        EDD:   07/10/20  Best:          28w 3d     Det. By:  LMP  (10/04/19)          EDD:   07/10/20 ---------------------------------------------------------------------- Anatomy (Fetus A)  Cranium:               Appears normal         LVOT:                   Not well visualized  Cavum:                 Appears normal         Aortic Arch:            Not well visualized  Ventricles:            Appears normal         Ductal Arch:            Not well visualized  Choroid Plexus:        Appears normal         Diaphragm:              Appears normal  Cerebellum:            Not well visualized    Stomach:                Appears normal, left                                                                        sided  Posterior Fossa:       Not well visualized    Abdomen:                Appears normal  Nuchal Fold:           Not applicable (>20    Abdominal Wall:         Not well visualized                         wks GA)  Face:                  Appears normal         Cord Vessels:           Appears normal (3                         (orbits and profile)  vessel cord)  Lips:                  Appears normal         Kidneys:                Appear normal  Palate:                Not well visualized    Bladder:                Appears normal  Thoracic:              Appears normal         Spine:                  Ltd views no                                                                        intracranial signs of                                                                        NTD  Heart:                 Not well visualized    Upper Extremities:      Visualized  RVOT:                  Appears normal         Lower Extremities:      Visualized  Other:  Fetus appears to be a  female. Technically difficult due to advanced          gestational age. ---------------------------------------------------------------------- Doppler - Fetal Vessels (Fetus A)  Umbilical Artery   S/D     %tile      RI               PI                     ADFV    RDFV   2.97       50    0.66             1.07                         N       N ---------------------------------------------------------------------- Fetal Evaluation (Fetus B)  Num Of Fetuses:         2  Fetal Heart Rate(bpm):  150  Cardiac Activity:       Observed  Fetal Lie:              Maternal right side  Presentation:           Breech  Placenta:               Anterior  P. Cord Insertion:      Visualized, central  Membrane Desc:  Dividing Membrane seen - Dichorionic.  Amniotic Fluid  AFI FV:      Within normal limits                              Largest Pocket(cm)                              4.7 ---------------------------------------------------------------------- Biometry (Fetus B)  BPD:      70.1  mm     G. Age:  28w 1d         29  %    CI:        85.55   %    70 - 86                                                          FL/HC:      17.7   %    18.8 - 20.6  HC:      238.7  mm     G. Age:  26w 0d        < 1  %    HC/AC:      1.19        1.05 - 1.21  AC:       201   mm     G. Age:  24w 5d        < 1  %    FL/BPD:     60.2   %    71 - 87  FL:       42.2  mm     G. Age:  23w 5d        < 1  %    FL/AC:      21.0   %    20 - 24  HUM:      39.3  mm     G. Age:  24w 0d        < 5  %  Est. FW:     720  gm      1 lb 9 oz    < 1  %     FW Discordancy        40  % ---------------------------------------------------------------------- Gestational Age (Fetus B)  LMP:           28w 3d        Date:  10/04/19                 EDD:   07/10/20  U/S Today:     25w 5d                                        EDD:   07/29/20  Best:          28w 3d     Det. By:  LMP  (10/04/19)          EDD:   07/10/20  ---------------------------------------------------------------------- Anatomy (Fetus B)  Cranium:               Appears normal         LVOT:  Not well visualized  Cavum:                 Appears normal         Aortic Arch:            Not well visualized  Ventricles:            Appears normal         Ductal Arch:            Not well visualized  Choroid Plexus:        Appears normal         Diaphragm:              Appears normal  Cerebellum:            Not well visualized    Stomach:                Appears normal, left                                                                        sided  Posterior Fossa:       Not well visualized    Abdomen:                Appears normal  Nuchal Fold:           Not applicable (>20    Abdominal Wall:         Not well visualized                         wks GA)  Face:                  Appears normal         Cord Vessels:           Appears normal (3                         (orbits and profile)                           vessel cord)  Lips:                  Appears normal         Kidneys:                Appear normal  Palate:                Not well visualized    Bladder:                Appears normal  Thoracic:              Appears normal         Spine:                  Ltd views no  intracranial signs of                                                                        NTD  Heart:                 Appears normal         Upper Extremities:      Visualized                         (4CH, axis, and                         situs)  RVOT:                  Not well visualized    Lower Extremities:      Visualized ---------------------------------------------------------------------- Doppler - Fetal Vessels (Fetus B)  Umbilical Artery   S/D     %tile      RI               PI                     ADFV    RDFV   4.45       96    0.78             1.36                         N       N  ---------------------------------------------------------------------- Cervix Uterus Adnexa  Cervix  Not visualized (advanced GA >24wks)  Uterus  No abnormality visualized.  Right Ovary  No adnexal mass visualized.  Left Ovary  No adnexal mass visualized.  Cul De Sac  No free fluid seen.  Adnexa  No abnormality visualized. ---------------------------------------------------------------------- Impression  On ultrasound, we confirmed dichorionic-diamniotic twin  pregnancy.  Twin A: Maternal left, cephalic presentation, anterior  placenta, female fetus.  Mild polyhydramnios is seen (maximum  vertical pocket measuring 10.4 cm).  Fetal growth is  appropriate for gestational age.  The estimated fetal weight is  at the 31st percentile.  Fetal anatomical survey appears  normal but limited by advanced gestational age.  Umbilical  artery Doppler showed slightly increased S/D ratio.  Twin B: Maternal right, breech presentation, anterio lateral  placenta, female fetus.  Amniotic fluid is normal and good  fetal activity seen.  The estimated fetal weight is at less than  the 1st percentile.  Fetal anatomical survey appears normal  but limited by advanced gestational age.  Umbilical artery  Doppler showed normal forward diastolic flow.  Growth discordance: 40% (abnormal).  xxxxxxxxxxxxxxxxxxxxxxxxxxxxxxxxxxxxxxxxxxx  Consultation (from [facility])  Name: Joanne Merritt  DOB: 03-Nov-1986  MRN: [id number]  Referring Provider: Doristine Counter, MD  I had the pleasure of seeing Ms. Habel today at the [facility]. She is a G5 P84 with dichorionic-  diamniotic twin pregnancy at 68w 3d gestation and on  ultrasound performed at your office selective fetal growth  restriction of twin B was detected.  Her prenatal care has been, otherwise, uneventful.  She is  yet to undergo  screening for gestational diabetes.  Patient  reports she had midtrimester fetal anatomy scan that was  reported as normal.  She had opted not to screen for  fetal  aneuploidies.  Obstetric history significant for a term cesarean delivery  (nonreassuring fetal heart trace) in 2018 at [facility].  She delivered a female infant weighing 8 pounds at  birth and her son is in good health.  She also had early miscarriages.  GYN history: No history of abnormal Pap smears or cervical  surgeries. No history of breast disease.  Her previous cycles  were reportedly regular.  Past medical history: Childhood asthma.  No history of  diabetes or hypertension or sickle cell trait.  Past surgical history: D&C, cesarean section.  Medications: Prenatal vitamins, vitamin D.  Allergies: Penicillin (hives).  Social history: Denies tobacco drug or alcohol use.  Her  partner is African-American and he is in good health he is  also the father of her first child.  Family history: No history of venous thromboembolism in the  family.  I counseled the patient on the following:  Dichorionic-diamniotic twin pregnancy with selective fetal  growth restriction (twin B)  -Explained the chorionicity with diagrams and the usually  favorable outcome of one or both twins and dichorionic twin  gestation.  -Patient has selective severe fetal growth restriction of twin B.  -Discussed the possible causes of fetal growth restriction  including placental insufficiency (most-common cause), fetal  infections (unlikely), fetal chromosomal anomalies or genetic  conditions.  -Discussed the significance and frequency of antenatal  testing to detect fetal compromise and plan delivery.  Weekly  antenatal testing is recommended from now till delivery.  -I informed her that only amniocentesis will be able to  determine the fetal karyotype or genetic conditions  Doctor, general practice).  I explained amniocentesis procedure and  possible complications including preterm delivery or PPROM  in 1 and 500 procedures.  Patient opted not to have  amniocentesis.  -Our goal is to prolong the pregnancy with frequent antenatal  testing.   -Timing of delivery would be based on interval growth and  antenatal testing.  -If good interval growth is seen in twin B we would  recommend delivery at [redacted] weeks gestation provided  antenatal testing remains reassuring.  However, if poor  interval growth is seen or if antenatal testing is abnormal  earlier delivery would be recommended.  -Patient understands that preterm delivery of twins for growth  restriction in one twin places the other healthy twin at risk of  prematurity complications.  -I informed her fetal growth restriction may proceed  development of preeclampsia in some cases.  Blood  pressure today at our office was 121/83 mmHg.  -I discussed the role of antenatal corticosteroids for fetal  pulmonary maturity that are preferably given when delivery is  anticipated.  Recommendations:  -Weekly umbilical artery Doppler to continue.  -NST with Doppler studies  -Fetal growth assessment in 3 weeks.  -BPP from 31 to [redacted] weeks gestation.  -Delivery at [redacted] weeks gestation or earlier as indicated.  -Gestational diabetes screening now as antenatal  corticosteroids may be indicated.  Thank you for your consultation if you have any questions or  concerns please do not hesitate to contact me at the [facility].  Consultation including face-to-face counseling: 40 minutes. ----------------------------------------------------------------------                  Noralee Space, MD Electronically Signed Final Report  04/20/2020 06:03 pm ----------------------------------------------------------------------  [place] MFM OB DETAIL ADDL GEST +14 WK  Result Date: 04/20/2020 ----------------------------------------------------------------------  OBSTETRICS REPORT                       (Signed Final 04/20/2020 06:03 pm) ---------------------------------------------------------------------- Patient Info  ID #:       [id number]                          D.O.B.:  11/27/1986 (34 yrs)  Name:       Joanne Merritt                  Visit Date: 04/20/2020 10:18 am ---------------------------------------------------------------------- Performed By  Attending:        Noralee Space MD        Ref. Address:     [address]                                                             [address]  Performed By:     Percell Boston          [facility]  Referred By:      Yehuda Mao MD ---------------------------------------------------------------------- Orders  #  Description                           Code        Ordered By  1  [place] MFM OB DETAIL +14 WK               76811.01    CECILIA BANGA  2  [place] MFM OB DETAIL ADDL GEST            76811.02    CECILIA BANGA     +14 WK  3  [place] MFM UA CORD DOPPLER                [id number]    CECILIA BANGA  4  [place] MFM UA ADDL GEST                   76820.01    CECILIA BANGA ----------------------------------------------------------------------  #  Order #                     Accession #                Episode #  1  [id number]                   [id number]                 [id number]  2  [id number]  [id number]                 [id number]  3  [id number]                   [id number]                 [id number]  4  [id number]                   [id number]                 [id number] ---------------------------------------------------------------------- Indications  Maternal care for known or suspected poor      O36.5932  fetal growth, third trimester, fetus 2 IUGR  [redacted] weeks gestation of pregnancy                Z3A.28  Encounter for antenatal screening for          Z36.3  malformations  Twin pregnancy, di/di, third trimester         O50.043  Advanced maternal age multigravida 11+,        O36.523  third trimester  Polyhydramnios, third trimester, antepartum    O40.3XX1  condition or complication, fetus 1  Obesity complicating pregnancy, third          O99.213  trimester  Previous cesarean delivery,  antepartum x 1     O34.219  Declined genetic testing ---------------------------------------------------------------------- Fetal Evaluation (Fetus A)  Num Of Fetuses:         2  Fetal Heart Rate(bpm):  132  Cardiac Activity:       Observed  Fetal Lie:              Maternal left side  Presentation:           Cephalic  Placenta:               Anterior  P. Cord Insertion:      Visualized, central  Membrane Desc:      Dividing Membrane seen - Dichorionic.  Amniotic Fluid  AFI FV:      Polyhydramnios                              Largest Pocket(cm)                              10.4 ---------------------------------------------------------------------- Biometry (Fetus A)  BPD:      73.8  mm     G. Age:  29w 4d         75  %    CI:        84.18   %    70 - 86                                                          FL/HC:      20.2   %    18.8 - 20.6  HC:      253.5  mm     G. Age:  27w 4d        4.8  %    HC/AC:      1.03        1.05 - 1.21  AC:      245.4  mm     G. Age:  28w 6d         54  %    FL/BPD:     69.4   %    71 - 87  FL:       51.2  mm     G. Age:  27w 3d         11  %    FL/AC:      20.9   %    20 - 24  HUM:      48.1  mm     G. Age:  28w 1d         41  %  Est. FW:    1199  gm    2 lb 10 oz      31  %     FW Discordancy     0 \ 40 % ---------------------------------------------------------------------- OB History  Gravidity:    5         Term:   1        Prem:   0        SAB:   2  TOP:          0       Ectopic:  1        Living: 1 ---------------------------------------------------------------------- Gestational Age (Fetus A)  LMP:           28w 3d        Date:  10/04/19                 EDD:   07/10/20  U/S Today:     28w 3d                                        EDD:   07/10/20  Best:          28w 3d     Det. By:  LMP  (10/04/19)          EDD:   07/10/20 ---------------------------------------------------------------------- Anatomy (Fetus A)  Cranium:               Appears normal         LVOT:                    Not well visualized  Cavum:                 Appears normal         Aortic Arch:            Not well visualized  Ventricles:            Appears normal         Ductal Arch:            Not well visualized  Choroid Plexus:        Appears normal         Diaphragm:              Appears normal  Cerebellum:            Not well visualized    Stomach:                Appears normal, left  sided  Posterior Fossa:       Not well visualized    Abdomen:                Appears normal  Nuchal Fold:           Not applicable (>20    Abdominal Wall:         Not well visualized                         wks GA)  Face:                  Appears normal         Cord Vessels:           Appears normal (3                         (orbits and profile)                           vessel cord)  Lips:                  Appears normal         Kidneys:                Appear normal  Palate:                Not well visualized    Bladder:                Appears normal  Thoracic:              Appears normal         Spine:                  Ltd views no                                                                        intracranial signs of                                                                        NTD  Heart:                 Not well visualized    Upper Extremities:      Visualized  RVOT:                  Appears normal         Lower Extremities:      Visualized  Other:  Fetus appears to be a female. Technically difficult due to advanced          gestational age. ---------------------------------------------------------------------- Doppler - Fetal Vessels (Fetus A)  Umbilical Artery   S/D     %tile      RI  PI                     ADFV    RDFV   2.97       50    0.66             1.07                         N       N ---------------------------------------------------------------------- Fetal Evaluation (Fetus B)  Num Of Fetuses:         2  Fetal Heart  Rate(bpm):  150  Cardiac Activity:       Observed  Fetal Lie:              Maternal right side  Presentation:           Breech  Placenta:               Anterior  P. Cord Insertion:      Visualized, central  Membrane Desc:      Dividing Membrane seen - Dichorionic.  Amniotic Fluid  AFI FV:      Within normal limits                              Largest Pocket(cm)                              4.7 ---------------------------------------------------------------------- Biometry (Fetus B)  BPD:      70.1  mm     G. Age:  28w 1d         29  %    CI:        85.55   %    70 - 86                                                          FL/HC:      17.7   %    18.8 - 20.6  HC:      238.7  mm     G. Age:  26w 0d        < 1  %    HC/AC:      1.19        1.05 - 1.21  AC:       201   mm     G. Age:  24w 5d        < 1  %    FL/BPD:     60.2   %    71 - 87  FL:       42.2  mm     G. Age:  23w 5d        < 1  %    FL/AC:      21.0   %    20 - 24  HUM:      39.3  mm     G. Age:  24w 0d        < 5  %  Est. FW:     720  gm      1 lb 9 oz    < 1  %  FW Discordancy        40  % ---------------------------------------------------------------------- Gestational Age (Fetus B)  LMP:           28w 3d        Date:  10/04/19                 EDD:   07/10/20  U/S Today:     25w 5d                                        EDD:   07/29/20  Best:          28w 3d     Det. By:  LMP  (10/04/19)          EDD:   07/10/20 ---------------------------------------------------------------------- Anatomy (Fetus B)  Cranium:               Appears normal         LVOT:                   Not well visualized  Cavum:                 Appears normal         Aortic Arch:            Not well visualized  Ventricles:            Appears normal         Ductal Arch:            Not well visualized  Choroid Plexus:        Appears normal         Diaphragm:              Appears normal  Cerebellum:            Not well visualized    Stomach:                Appears normal, left                                                                         sided  Posterior Fossa:       Not well visualized    Abdomen:                Appears normal  Nuchal Fold:           Not applicable (>20    Abdominal Wall:         Not well visualized                         wks GA)  Face:                  Appears normal         Cord Vessels:           Appears normal (3                         (orbits and profile)  vessel cord)  Lips:                  Appears normal         Kidneys:                Appear normal  Palate:                Not well visualized    Bladder:                Appears normal  Thoracic:              Appears normal         Spine:                  Ltd views no                                                                        intracranial signs of                                                                        NTD  Heart:                 Appears normal         Upper Extremities:      Visualized                         (4CH, axis, and                         situs)  RVOT:                  Not well visualized    Lower Extremities:      Visualized ---------------------------------------------------------------------- Doppler - Fetal Vessels (Fetus B)  Umbilical Artery   S/D     %tile      RI               PI                     ADFV    RDFV   4.45       96    0.78             1.36                         N       N ---------------------------------------------------------------------- Cervix Uterus Adnexa  Cervix  Not visualized (advanced GA >24wks)  Uterus  No abnormality visualized.  Right Ovary  No adnexal mass visualized.  Left Ovary  No adnexal mass visualized.  Cul De Sac  No free fluid seen.  Adnexa  No abnormality visualized. ---------------------------------------------------------------------- Impression  On ultrasound, we confirmed dichorionic-diamniotic twin  pregnancy.  Twin A: Maternal left, cephalic presentation, anterior  placenta, female fetus.  Mild  polyhydramnios is seen (maximum  vertical pocket measuring 10.4 cm).  Fetal growth is  appropriate for gestational age.  The estimated fetal weight is  at the 31st percentile.  Fetal anatomical survey appears  normal but limited by advanced gestational age.  Umbilical  artery Doppler showed slightly increased S/D ratio.  Twin B: Maternal right, breech presentation, anterio lateral  placenta, female fetus.  Amniotic fluid is normal and good  fetal activity seen.  The estimated fetal weight is at less than  the 1st percentile.  Fetal anatomical survey appears normal  but limited by advanced gestational age.  Umbilical artery  Doppler showed normal forward diastolic flow.  Growth discordance: 40% (abnormal).  xxxxxxxxxxxxxxxxxxxxxxxxxxxxxxxxxxxxxxxxxxx  Consultation (from [facility])  Name: Joanne Merritt  DOB: 01-05-1986  MRN: [id number]  Referring Provider: Doristine Counter, MD  I had the pleasure of seeing Ms. Tremper today at the Center  [facility]. She is a G5 P33 with dichorionic-  diamniotic twin pregnancy at 25w 3d gestation and on  ultrasound performed at your office selective fetal growth  restriction of twin B was detected.  Her prenatal care has been, otherwise, uneventful.  She is  yet to undergo screening for gestational diabetes.  Patient  reports she had midtrimester fetal anatomy scan that was  reported as normal.  She had opted not to screen for fetal  aneuploidies.  Obstetric history significant for a term cesarean delivery  (nonreassuring fetal heart trace) in 2018 at [facility].  She delivered a female infant weighing 8 pounds at  birth and her son is in good health.  She also had early miscarriages.  GYN history: No history of abnormal Pap smears or cervical  surgeries. No history of breast disease.  Her previous cycles  were reportedly regular.  Past medical history: Childhood asthma.  No history of  diabetes or hypertension or sickle cell trait.  Past surgical history: D&C, cesarean  section.  Medications: Prenatal vitamins, vitamin D.  Allergies: Penicillin (hives).  Social history: Denies tobacco drug or alcohol use.  Her  partner is African-American and he is in good health he is  also the father of her first child.  Family history: No history of venous thromboembolism in the  family.  I counseled the patient on the following:  Dichorionic-diamniotic twin pregnancy with selective fetal  growth restriction (twin B)  -Explained the chorionicity with diagrams and the usually  favorable outcome of one or both twins and dichorionic twin  gestation.  -Patient has selective severe fetal growth restriction of twin B.  -Discussed the possible causes of fetal growth restriction  including placental insufficiency (most-common cause), fetal  infections (unlikely), fetal chromosomal anomalies or genetic  conditions.  -Discussed the significance and frequency of antenatal  testing to detect fetal compromise and plan delivery.  Weekly  antenatal testing is recommended from now till delivery.  -I informed her that only amniocentesis will be able to  determine the fetal karyotype or genetic conditions  Doctor, general practice).  I explained amniocentesis procedure and  possible complications including preterm delivery or PPROM  in 1 and 500 procedures.  Patient opted not to have  amniocentesis.  -Our goal is to prolong the pregnancy with frequent antenatal  testing.  -Timing of delivery would be based on interval growth and  antenatal testing.  -If good interval growth is seen in twin B we would  recommend delivery at [redacted] weeks gestation provided  antenatal testing remains reassuring.  However, if poor  interval growth is seen or if antenatal testing is abnormal  earlier delivery would be recommended.  -Patient understands that preterm delivery of twins for growth  restriction in one twin places the other healthy twin at risk of  prematurity complications.  -I informed her fetal growth restriction may proceed  development  of preeclampsia in some cases.  Blood  pressure today at our office was 121/83 mmHg.  -I discussed the role of antenatal corticosteroids for fetal  pulmonary maturity that are preferably given when delivery is  anticipated.  Recommendations:  -Weekly umbilical artery Doppler to continue.  -NST with Doppler studies  -Fetal growth assessment in 3 weeks.  -BPP from 31 to [redacted] weeks gestation.  -Delivery at [redacted] weeks gestation or earlier as indicated.  -Gestational diabetes screening now as antenatal  corticosteroids may be indicated.  Thank you for your consultation if you have any questions or  concerns please do not hesitate to contact me at the Center  for maternal fetal [facility].  Consultation including face-to-face counseling: 40 minutes. ----------------------------------------------------------------------                  Noralee Space, MD Electronically Signed Final Report   04/20/2020 06:03 pm ----------------------------------------------------------------------  [place] MFM OB FOLLOW UP  Result Date: 05/11/2020 ----------------------------------------------------------------------  OBSTETRICS REPORT                         (Signed Final 05/11/2020 03:59 pm) ---------------------------------------------------------------------- Patient Info  ID #:        [id number]                          D.O.B.:  1986/05/02 (34 yrs)  Name:        Joanne Merritt                 Visit Date: 05/11/2020 12:53 pm ---------------------------------------------------------------------- Performed By  Attending:         Lin Landsman      Ref. Address:      [address]                     [place]                                        [phone]  Performed By:      Tommie Raymond BS,       [facility]                     RDMS, RVT                                 Fetal Care  Referred By:       Yehuda Mao MD ---------------------------------------------------------------------- Orders  #    Description                          Code         Ordered By  1   [place] MFM UA CORD DOPPLER  16109.60     RAVI SHANKAR  2   [place] MFM FETAL BPP W/NONSTRESS         45409.8      RAVI SHANKAR  3   [place] MFM OB FOLLOW UP                  76816.01     RAVI SHANKAR  4   [place] MFM OB FOLLOW UP ADDL             [id number]     RAVI SHANKAR      GEST  5   [place] MFM FETAL BPP W/NONSTRESS         11914.7      RAVI SHANKAR      ADD'L GEST  6   [place] MFM UA DOPPLER ADDL GEST          76820.03     RAVI SHANKAR      RE EVAL ----------------------------------------------------------------------  #   Order #                    Accession #                 Episode #  1   [id number]                  [id number]  2   [id number]                  [id number]                  [id number]  3   [id number]                  [id number]                  [id number]  4   [id number]                  [id number]  5   [id number]                  [id number]                  [id number]  6   [id number]                  [id number]                  [id number] ---------------------------------------------------------------------- Indications  [redacted] weeks gestation of pregnancy                 Z3A.31  Maternal care for known or suspected poor       O36.5932  fetal growth, third trimester, fetus 2 IUGR  Twin pregnancy, di/di, third trimester          O30.043  Encounter for other antenatal screening         Z36.2  follow-up  Advanced maternal age multigravida 34+, third   O63.523  trimester  Polyhydramnios, third trimester, antepartum     O40.3XX1  condition or complication, fetus 1  Obesity complicating pregnancy, third trimester O99.213  Previous cesarean delivery, antepartum x 1      O34.219  Declined genetic testing ---------------------------------------------------------------------- Fetal Evaluation (Fetus A)  Num Of Fetuses:  2  Fetal Heart Rate(bpm):   143  Cardiac Activity:        Observed  Fetal Lie:                Maternal left side  Presentation:            Cephalic  Placenta:                Anterior Fundal  P. Cord Insertion:       Previously Visualized  Membrane Desc:       Dividing Membrane seen - Dichorionic.  Amniotic Fluid  AFI FV:      Subjectively upper-normal                              Largest Pocket(cm)                              6.6 ---------------------------------------------------------------------- Biophysical Evaluation (Fetus A)  Amniotic F.V:   Pocket => 2 cm              F. Tone:         Observed  F. Movement:    Observed                    Score:           6/8  F. Breathing:   Not Observed ---------------------------------------------------------------------- Biometry (Fetus A)  BPD:      78.4   mm     G. Age:  31w 3d         41  %    CI:         82.66  %    70 - 86                                                           FL/HC:       21.0  %    19.3 - 21.3  HC:        272   mm     G. Age:  29w 5d        < 1  %    HC/AC:       1.00       0.96 - 1.17  AC:      272.1   mm     G. Age:  31w 2d         43  %    FL/BPD:      73.0  %    71 - 87  FL:       57.2   mm     G. Age:  30w 0d          8  %    FL/AC:       21.0  %    20 - 24  CER:      42.4   mm     G. Age:  36w 3d       > 95  %  LV:         3.9  mm  CM:         5.8  mm  Est. FW:  1629   gm      3 lb 9 oz     19  %     FW Discordancy     0 \ 37  % ---------------------------------------------------------------------- OB History  Gravidity:     5         Term:  1          Prem:  0        SAB:   2  TOP:           0       Ectopic: 1         Living: 1 ---------------------------------------------------------------------- Gestational Age (Fetus A)  LMP:            31w 3d       Date:  10/04/19                   EDD:  07/10/20  U/S Today:      30w 4d                                         EDD:  07/16/20  Best:           31w 3d    Det. By:  LMP  (10/04/19)            EDD:  07/10/20 ----------------------------------------------------------------------  Anatomy (Fetus A)  Cranium:                Appears normal         LVOT:                   Not well visualized  Cavum:                  Appears normal         Aortic Arch:            Not well visualized  Ventricles:             Appears normal         Ductal Arch:            Not well visualized  Choroid Plexus:         Previously seen        Diaphragm:              Appears normal  Cerebellum:             Appears normal         Stomach:                Appears normal, left                                                                         sided  Posterior Fossa:        Not well visualized    Abdomen:                Appears normal  Nuchal Fold:            Not applicable (>20    Abdominal Wall:  Appears nml (cord                          wks GA)                                        insert, abd wall)  Face:                   Appears normal         Cord Vessels:           Appears normal (3                          (orbits and profile)                           vessel cord)  Lips:                   Appears normal         Kidneys:                Appear normal  Palate:                 Not well visualized    Bladder:                Appears normal  Thoracic:               Appears normal         Spine:                  Ltd views no                                                                         intracranial signs of                                                                         NTD  Heart:                  Appears normal         Upper Extremities:      Visualized                          (4CH, axis, and situs)  RVOT:                   Not well visualized    Lower Extremities:      Visualized  Other:   Fetus appears to be a female. Technically difficult due to advanced           gestational age. ---------------------------------------------------------------------- Doppler - Fetal Vessels (Fetus A)  Umbilical Artery    S/D     %  tile      RI                                      ADFV    RDFV   4.13         97   0.76                                          No      No ---------------------------------------------------------------------- Fetal Evaluation (Fetus B)  Num Of Fetuses:          2  Fetal Heart Rate(bpm):   136  Cardiac Activity:        Observed  Fetal Lie:               Maternal right side  Presentation:            Oblique/ Trv, head to maternal right  Placenta:                Anterior  P. Cord Insertion:       Previously Visualized  Membrane Desc:       Dividing Membrane seen - Dichorionic.  Amniotic Fluid  AFI FV:      Polyhydramnios                              Largest Pocket(cm)                              9.1 ---------------------------------------------------------------------- Biophysical Evaluation (Fetus B)  Amniotic F.V:   Pocket => 2 cm              F. Tone:         Observed  F. Movement:    Observed                    Score:           8/8  F. Breathing:   Observed ---------------------------------------------------------------------- Biometry (Fetus B)  BPD:      77.9   mm     G. Age:  31w 2d         34  %    CI:         85.71  %    70 - 86                                                           FL/HC:       18.1  %    19.3 - 21.3  HC:        265   mm     G. Age:  28w 6d        < 1  %    HC/AC:       1.18       0.96 - 1.17  AC:      224.9   mm     G. Age:  26w 6d        < 1  %  FL/BPD:      61.6  %    71 - 87  FL:         48   mm     G. Age:  26w 1d        < 1  %    FL/AC:       21.3  %    20 - 24  CER:      37.5   mm     G. Age:  32w 2d         61  %  LV:         8.9  mm  CM:         5.9  mm  Est. FW:    1019   gm      2 lb 4 oz    < 1  %     FW Discordancy        37   % ---------------------------------------------------------------------- Gestational Age (Fetus B)  LMP:            31w 3d       Date:  10/04/19                   EDD:  07/10/20  U/S Today:      28w 2d                                         EDD:  08/01/20  Best:           31w 3d    Det. By:  LMP  (10/04/19)            EDD:   07/10/20 ---------------------------------------------------------------------- Anatomy (Fetus B)  Cranium:                Appears normal         LVOT:                   Not well visualized  Cavum:                  Appears normal         Aortic Arch:            Not well visualized  Ventricles:             Appears normal         Ductal Arch:            Not well visualized  Choroid Plexus:         Appears normal         Diaphragm:              Appears normal  Cerebellum:             Appears normal         Stomach:                Appears normal, left                                                                         sided  Posterior Fossa:        Not well visualized    Abdomen:                Appears normal  Nuchal Fold:            Not applicable (>20    Abdominal Wall:         Appears nml (cord                          wks GA)                                        insert, abd wall)  Face:                   Appears normal         Cord Vessels:           Previously seen                          (orbits and profile)  Lips:                   Appears normal         Kidneys:                Appear normal  Palate:                 Not well visualized    Bladder:                Appears normal  Thoracic:               Appears normal         Spine:                  Limited views                                                                         previously seen  Heart:                  Appears normal         Upper Extremities:      Visualized                          (4CH, axis, and situs)  RVOT:                   Not well visualized    Lower Extremities:      Visualized  Other:   Fetus appears to be female. Technically difficult due to advanced GA           and fetal position. ---------------------------------------------------------------------- Doppler - Fetal Vessels (Fetus B)  Umbilical Artery    S/D     %tile  ADFV    RDFV   3.17        75                                                  No      No ---------------------------------------------------------------------- Cervix Uterus Adnexa  Cervix  Not visualized (advanced GA >24wks)  Uterus  No abnormality visualized.  Right Ovary  Within normal limits. No adnexal mass visualized.  Left Ovary  Within normal limits. No adnexal mass visualized.  Cul De Sac  No free fluid seen.  Adnexa  No abnormality visualized. ---------------------------------------------------------------------- Impression  Follow up diamniotic dichorionic twin growth with IUGR of Twin  B.  There is positive interval growth however, interval growth of  Twin B significantly lags Twin with current discordance at 37%.  Twin A BPP 6/8 with elevated UA Dopplers, normal amniotic fluid  and movement, NST reactive  Twin B BPP 8/8 with normal UA Dopplers, normal amniotic fluid  and movement, NST reactive with a prolonged deceleration  Suboptimal views of the fetal anatomy was again observed in  both Twins.  Ms. Deitrick conveyed that she feels good fetal movement from  both Twins daily.  I discussed today's visit with Ms. Joanne Merritt and reviewed  today's findings of Twin B with a prolonged deceleration during  40 minutes of monitoring. I recommended further evaluation and  monitoring at the MAU.  I spoke to Dr. Mindi Slicker who referred me to her colleague on call. I  left them a message. ---------------------------------------------------------------------- Recommendations  To MAU for further monitoring  Continue weekly testing with UA Dopplers and NST's given  abnormal Dopplers of Twin A and fetal growth discordance.  Consider delivery between 35-37 weeks given twin pregnancy  abnormal Dopplers and discordance. ( she is scheduled for 36  weeks for 06/13/20 ----------------------------------------------------------------------               Lin Landsman, MD Electronically Signed Final Report   05/11/2020 03:59 pm  ----------------------------------------------------------------------  [place] MFM OB FOLLOW UP ADDL GEST  Result Date: 05/11/2020 ----------------------------------------------------------------------  OBSTETRICS REPORT                         (Signed Final 05/11/2020 03:59 pm) ---------------------------------------------------------------------- Patient Info  ID #:        [id number]                          D.O.B.:  11/08/1986 (34 yrs)  Name:        Joanne Merritt                 Visit Date: 05/11/2020 12:53 pm ---------------------------------------------------------------------- Performed By  Attending:         Lin Landsman      Ref. Address:      [address]                     [place]                                        [phone]  Performed By:      Tommie Raymond BS,       [facility]                     RDMS, RVT                                 Fetal Care  Referred By:       Yehuda Mao MD ---------------------------------------------------------------------- Orders  #   Description                          Code         Ordered By  1   [place] MFM UA CORD DOPPLER               76820.02     RAVI SHANKAR  2   [place] MFM FETAL BPP W/NONSTRESS         16109.6      RAVI SHANKAR  3   [place] MFM OB FOLLOW UP                  76816.01     RAVI SHANKAR  4   [place] MFM OB FOLLOW UP ADDL             04540.98     RAVI SHANKAR      GEST  5   [place] MFM FETAL BPP W/NONSTRESS         11914.7      RAVI SHANKAR      ADD'L GEST  6   [place] MFM UA DOPPLER ADDL GEST          76820.03     RAVI SHANKAR      RE EVAL ----------------------------------------------------------------------  #   Order #                    Accession #                 Episode #  1   [id number]                  [id number]  2   [id number]                  [id number]                  [id number]  3   [id number]                  [id number]                  [id number]  4   [id number]                  [id number]                   [id number]  5   [id number]                  [id number]                  [id number]  6   [id number]                  [id number]  [id number] ---------------------------------------------------------------------- Indications  [redacted] weeks gestation of pregnancy                 Z3A.31  Maternal care for known or suspected poor       O36.5932  fetal growth, third trimester, fetus 2 IUGR  Twin pregnancy, di/di, third trimester          O30.043  Encounter for other antenatal screening         Z36.2  follow-up  Advanced maternal age multigravida 66+, third   O27.523  trimester  Polyhydramnios, third trimester, antepartum     O40.3XX1  condition or complication, fetus 1  Obesity complicating pregnancy, third trimester O99.213  Previous cesarean delivery, antepartum x 1      O34.219  Declined genetic testing ---------------------------------------------------------------------- Fetal Evaluation (Fetus A)  Num Of Fetuses:          2  Fetal Heart Rate(bpm):   143  Cardiac Activity:        Observed  Fetal Lie:               Maternal left side  Presentation:            Cephalic  Placenta:                Anterior Fundal  P. Cord Insertion:       Previously Visualized  Membrane Desc:       Dividing Membrane seen - Dichorionic.  Amniotic Fluid  AFI FV:      Subjectively upper-normal                              Largest Pocket(cm)                              6.6 ---------------------------------------------------------------------- Biophysical Evaluation (Fetus A)  Amniotic F.V:   Pocket => 2 cm              F. Tone:         Observed  F. Movement:    Observed                    Score:           6/8  F. Breathing:   Not Observed ---------------------------------------------------------------------- Biometry (Fetus A)  BPD:      78.4   mm     G. Age:  31w 3d         41  %    CI:         82.66  %    70 - 86                                                           FL/HC:       21.0  %    19.3 - 21.3  HC:         272   mm     G. Age:  29w 5d        < 1  %    HC/AC:       1.00       0.96 - 1.17  AC:      272.1  mm     G. Age:  31w 2d         43  %    FL/BPD:      73.0  %    71 - 87  FL:       57.2   mm     G. Age:  30w 0d          8  %    FL/AC:       21.0  %    20 - 24  CER:      42.4   mm     G. Age:  36w 3d       > 95  %  LV:         3.9  mm  CM:         5.8  mm  Est. FW:    1629   gm      3 lb 9 oz     19  %     FW Discordancy     0 \ 37  % ---------------------------------------------------------------------- OB History  Gravidity:     5         Term:  1          Prem:  0        SAB:   2  TOP:           0       Ectopic: 1         Living: 1 ---------------------------------------------------------------------- Gestational Age (Fetus A)  LMP:            31w 3d       Date:  10/04/19                   EDD:  07/10/20  U/S Today:      30w 4d                                         EDD:  07/16/20  Best:           31w 3d    Det. By:  LMP  (10/04/19)            EDD:  07/10/20 ---------------------------------------------------------------------- Anatomy (Fetus A)  Cranium:                Appears normal         LVOT:                   Not well visualized  Cavum:                  Appears normal         Aortic Arch:            Not well visualized  Ventricles:             Appears normal         Ductal Arch:            Not well visualized  Choroid Plexus:         Previously seen        Diaphragm:              Appears normal  Cerebellum:             Appears normal  Stomach:                Appears normal, left                                                                         sided  Posterior Fossa:        Not well visualized    Abdomen:                Appears normal  Nuchal Fold:            Not applicable (>20    Abdominal Wall:         Appears nml (cord                          wks GA)                                        insert, abd wall)  Face:                   Appears normal         Cord Vessels:           Appears  normal (3                          (orbits and profile)                           vessel cord)  Lips:                   Appears normal         Kidneys:                Appear normal  Palate:                 Not well visualized    Bladder:                Appears normal  Thoracic:               Appears normal         Spine:                  Ltd views no                                                                         intracranial signs of  NTD  Heart:                  Appears normal         Upper Extremities:      Visualized                          (4CH, axis, and situs)  RVOT:                   Not well visualized    Lower Extremities:      Visualized  Other:   Fetus appears to be a female. Technically difficult due to advanced           gestational age. ---------------------------------------------------------------------- Doppler - Fetal Vessels (Fetus A)  Umbilical Artery    S/D     %tile      RI                                      ADFV    RDFV   4.13        97   0.76                                          No      No ---------------------------------------------------------------------- Fetal Evaluation (Fetus B)  Num Of Fetuses:          2  Fetal Heart Rate(bpm):   136  Cardiac Activity:        Observed  Fetal Lie:               Maternal right side  Presentation:            Oblique/ Trv, head to maternal right  Placenta:                Anterior  P. Cord Insertion:       Previously Visualized  Membrane Desc:       Dividing Membrane seen - Dichorionic.  Amniotic Fluid  AFI FV:      Polyhydramnios                              Largest Pocket(cm)                              9.1 ---------------------------------------------------------------------- Biophysical Evaluation (Fetus B)  Amniotic F.V:   Pocket => 2 cm              F. Tone:         Observed  F. Movement:    Observed                    Score:           8/8  F. Breathing:   Observed  ---------------------------------------------------------------------- Biometry (Fetus B)  BPD:      77.9   mm     G. Age:  31w 2d         34  %    CI:         85.71  %    70 - 86  FL/HC:       18.1  %    19.3 - 21.3  HC:        265   mm     G. Age:  28w 6d        < 1  %    HC/AC:       1.18       0.96 - 1.17  AC:      224.9   mm     G. Age:  26w 6d        < 1  %    FL/BPD:      61.6  %    71 - 87  FL:         48   mm     G. Age:  26w 1d        < 1  %    FL/AC:       21.3  %    20 - 24  CER:      37.5   mm     G. Age:  32w 2d         61  %  LV:         8.9  mm  CM:         5.9  mm  Est. FW:    1019   gm      2 lb 4 oz    < 1  %     FW Discordancy        37   % ---------------------------------------------------------------------- Gestational Age (Fetus B)  LMP:            31w 3d       Date:  10/04/19                   EDD:  07/10/20  U/S Today:      28w 2d                                         EDD:  08/01/20  Best:           31w 3d    Det. By:  LMP  (10/04/19)            EDD:  07/10/20 ---------------------------------------------------------------------- Anatomy (Fetus B)  Cranium:                Appears normal         LVOT:                   Not well visualized  Cavum:                  Appears normal         Aortic Arch:            Not well visualized  Ventricles:             Appears normal         Ductal Arch:            Not well visualized  Choroid Plexus:         Appears normal         Diaphragm:              Appears normal  Cerebellum:  Appears normal         Stomach:                Appears normal, left                                                                         sided  Posterior Fossa:        Not well visualized    Abdomen:                Appears normal  Nuchal Fold:            Not applicable (>20    Abdominal Wall:         Appears nml (cord                          wks GA)                                        insert, abd wall)   Face:                   Appears normal         Cord Vessels:           Previously seen                          (orbits and profile)  Lips:                   Appears normal         Kidneys:                Appear normal  Palate:                 Not well visualized    Bladder:                Appears normal  Thoracic:               Appears normal         Spine:                  Limited views                                                                         previously seen  Heart:                  Appears normal         Upper Extremities:      Visualized                          (4CH, axis, and situs)  RVOT:  Not well visualized    Lower Extremities:      Visualized  Other:   Fetus appears to be female. Technically difficult due to advanced GA           and fetal position. ---------------------------------------------------------------------- Doppler - Fetal Vessels (Fetus B)  Umbilical Artery    S/D     %tile                                              ADFV    RDFV   3.17        75                                                 No      No ---------------------------------------------------------------------- Cervix Uterus Adnexa  Cervix  Not visualized (advanced GA >24wks)  Uterus  No abnormality visualized.  Right Ovary  Within normal limits. No adnexal mass visualized.  Left Ovary  Within normal limits. No adnexal mass visualized.  Cul De Sac  No free fluid seen.  Adnexa  No abnormality visualized. ---------------------------------------------------------------------- Impression  Follow up diamniotic dichorionic twin growth with IUGR of Twin  B.  There is positive interval growth however, interval growth of  Twin B significantly lags Twin with current discordance at 37%.  Twin A BPP 6/8 with elevated UA Dopplers, normal amniotic fluid  and movement, NST reactive  Twin B BPP 8/8 with normal UA Dopplers, normal amniotic fluid  and movement, NST reactive with a prolonged deceleration   Suboptimal views of the fetal anatomy was again observed in  both Twins.  Ms. Mcglown conveyed that she feels good fetal movement from  both Twins daily.  I discussed today's visit with Ms. Joanne Merritt and reviewed  today's findings of Twin B with a prolonged deceleration during  40 minutes of monitoring. I recommended further evaluation and  monitoring at the [facility]U.  I spoke to Dr. Mindi Slicker who referred me to her colleague on call. I  left them a message. ---------------------------------------------------------------------- Recommendations  To MAU for further monitoring  Continue weekly testing with UA Dopplers and NST's given  abnormal Dopplers of Twin A and fetal growth discordance.  Consider delivery between 35-37 weeks given twin pregnancy  abnormal Dopplers and discordance. ( she is scheduled for 36  weeks for 06/13/20 ----------------------------------------------------------------------               Lin Landsman, MD Electronically Signed Final Report   05/11/2020 03:59 pm ----------------------------------------------------------------------  [place] MFM UA ADDL GEST  Result Date: 04/20/2020 ----------------------------------------------------------------------  OBSTETRICS REPORT                       (Signed Final 04/20/2020 06:03 pm) ---------------------------------------------------------------------- Patient Info  ID #:       [id number]                          D.O.B.:  1986-01-25 (34 yrs)  Name:       Joanne Merritt                 Visit Date: 04/20/2020 10:18 am ---------------------------------------------------------------------- Performed By  Attending:        Daleen Bo  Judeth Cornfield MD        Ref. Address:     [address]                                                             [id number]  Performed By:     Percell Boston          Location:         [facility]                    RDMS                                     Fetal Care  Referred By:      Yehuda Mao MD  ---------------------------------------------------------------------- Orders  #  Description                           Code        Ordered By  1  [place] MFM OB DETAIL +14 WK               76811.01    CECILIA BANGA  2  [place] MFM OB DETAIL ADDL GEST            76811.02    CECILIA BANGA     +14 WK  3  [place] MFM UA CORD DOPPLER                76820.02    CECILIA BANGA  4  [place] MFM UA ADDL GEST                   76820.01    CECILIA BANGA ----------------------------------------------------------------------  #  Order #                     Accession #                Episode #  1  [id number]                   [id number]                 [id number]  2  [id number]                   [id number]                 [id number]  3  [id number]                   [id number]                 [id number]  4  [id number]                   [id number]                 [id number] ---------------------------------------------------------------------- Indications  Maternal care for known or suspected poor      O36.5932  fetal growth, third trimester, fetus 2 IUGR  [redacted] weeks gestation of pregnancy  Z3A.28  Encounter for antenatal screening for          Z36.3  malformations  Twin pregnancy, di/di, third trimester         O92.043  Advanced maternal age multigravida 62+,        O40.523  third trimester  Polyhydramnios, third trimester, antepartum    O40.3XX1  condition or complication, fetus 1  Obesity complicating pregnancy, third          O99.213  trimester  Previous cesarean delivery, antepartum x 1     O34.219  Declined genetic testing ---------------------------------------------------------------------- Fetal Evaluation (Fetus A)  Num Of Fetuses:         2  Fetal Heart Rate(bpm):  132  Cardiac Activity:       Observed  Fetal Lie:              Maternal left side  Presentation:           Cephalic  Placenta:               Anterior  P. Cord Insertion:      Visualized, central  Membrane Desc:      Dividing Membrane seen - Dichorionic.  Amniotic Fluid  AFI  FV:      Polyhydramnios                              Largest Pocket(cm)                              10.4 ---------------------------------------------------------------------- Biometry (Fetus A)  BPD:      73.8  mm     G. Age:  29w 4d         75  %    CI:        84.18   %    70 - 86                                                          FL/HC:      20.2   %    18.8 - 20.6  HC:      253.5  mm     G. Age:  27w 4d        4.8  %    HC/AC:      1.03        1.05 - 1.21  AC:      245.4  mm     G. Age:  28w 6d         54  %    FL/BPD:     69.4   %    71 - 87  FL:       51.2  mm     G. Age:  27w 3d         11  %    FL/AC:      20.9   %    20 - 24  HUM:      48.1  mm     G. Age:  28w 1d         41  %  Est. FW:    1199  gm    2 lb 10  oz      31  %     FW Discordancy     0 \ 40 % ---------------------------------------------------------------------- OB History  Gravidity:    5         Term:   1        Prem:   0        SAB:   2  TOP:          0       Ectopic:  1        Living: 1 ---------------------------------------------------------------------- Gestational Age (Fetus A)  LMP:           28w 3d        Date:  10/04/19                 EDD:   07/10/20  U/S Today:     28w 3d                                        EDD:   07/10/20  Best:          28w 3d     Det. By:  LMP  (10/04/19)          EDD:   07/10/20 ---------------------------------------------------------------------- Anatomy (Fetus A)  Cranium:               Appears normal         LVOT:                   Not well visualized  Cavum:                 Appears normal         Aortic Arch:            Not well visualized  Ventricles:            Appears normal         Ductal Arch:            Not well visualized  Choroid Plexus:        Appears normal         Diaphragm:              Appears normal  Cerebellum:            Not well visualized    Stomach:                Appears normal, left                                                                        sided  Posterior Fossa:        Not well visualized    Abdomen:                Appears normal  Nuchal Fold:           Not applicable (>20    Abdominal Wall:         Not well visualized  wks GA)  Face:                  Appears normal         Cord Vessels:           Appears normal (3                         (orbits and profile)                           vessel cord)  Lips:                  Appears normal         Kidneys:                Appear normal  Palate:                Not well visualized    Bladder:                Appears normal  Thoracic:              Appears normal         Spine:                  Ltd views no                                                                        intracranial signs of                                                                        NTD  Heart:                 Not well visualized    Upper Extremities:      Visualized  RVOT:                  Appears normal         Lower Extremities:      Visualized  Other:  Fetus appears to be a female. Technically difficult due to advanced          gestational age. ---------------------------------------------------------------------- Doppler - Fetal Vessels (Fetus A)  Umbilical Artery   S/D     %tile      RI               PI                     ADFV    RDFV   2.97       50    0.66             1.07                         N       N ---------------------------------------------------------------------- Fetal  Evaluation (Fetus B)  Num Of Fetuses:         2  Fetal Heart Rate(bpm):  150  Cardiac Activity:       Observed  Fetal Lie:              Maternal right side  Presentation:           Breech  Placenta:               Anterior  P. Cord Insertion:      Visualized, central  Membrane Desc:      Dividing Membrane seen - Dichorionic.  Amniotic Fluid  AFI FV:      Within normal limits                              Largest Pocket(cm)                              4.7 ---------------------------------------------------------------------- Biometry (Fetus B)   BPD:      70.1  mm     G. Age:  28w 1d         29  %    CI:        85.55   %    70 - 86                                                          FL/HC:      17.7   %    18.8 - 20.6  HC:      238.7  mm     G. Age:  26w 0d        < 1  %    HC/AC:      1.19        1.05 - 1.21  AC:       201   mm     G. Age:  24w 5d        < 1  %    FL/BPD:     60.2   %    71 - 87  FL:       42.2  mm     G. Age:  23w 5d        < 1  %    FL/AC:      21.0   %    20 - 24  HUM:      39.3  mm     G. Age:  24w 0d        < 5  %  Est. FW:     720  gm      1 lb 9 oz    < 1  %     FW Discordancy        40  % ---------------------------------------------------------------------- Gestational Age (Fetus B)  LMP:           28w 3d        Date:  10/04/19                 EDD:   07/10/20  U/S Today:     25w 5d  EDD:   07/29/20  Best:          Eden Emms 3d     Det. By:  LMP  (10/04/19)          EDD:   07/10/20 ---------------------------------------------------------------------- Anatomy (Fetus B)  Cranium:               Appears normal         LVOT:                   Not well visualized  Cavum:                 Appears normal         Aortic Arch:            Not well visualized  Ventricles:            Appears normal         Ductal Arch:            Not well visualized  Choroid Plexus:        Appears normal         Diaphragm:              Appears normal  Cerebellum:            Not well visualized    Stomach:                Appears normal, left                                                                        sided  Posterior Fossa:       Not well visualized    Abdomen:                Appears normal  Nuchal Fold:           Not applicable (>20    Abdominal Wall:         Not well visualized                         wks GA)  Face:                  Appears normal         Cord Vessels:           Appears normal (3                         (orbits and profile)                           vessel cord)  Lips:                  Appears  normal         Kidneys:                Appear normal  Palate:                Not well visualized    Bladder:                Appears normal  Thoracic:              Appears normal         Spine:                  Ltd views no                                                                        intracranial signs of                                                                        NTD  Heart:                 Appears normal         Upper Extremities:      Visualized                         (4CH, axis, and                         situs)  RVOT:                  Not well visualized    Lower Extremities:      Visualized ---------------------------------------------------------------------- Doppler - Fetal Vessels (Fetus B)  Umbilical Artery   S/D     %tile      RI               PI                     ADFV    RDFV   4.45       96    0.78             1.36                         N       N ---------------------------------------------------------------------- Cervix Uterus Adnexa  Cervix  Not visualized (advanced GA >24wks)  Uterus  No abnormality visualized.  Right Ovary  No adnexal mass visualized.  Left Ovary  No adnexal mass visualized.  Cul De Sac  No free fluid seen.  Adnexa  No abnormality visualized. ---------------------------------------------------------------------- Impression  On ultrasound, we confirmed dichorionic-diamniotic twin  pregnancy.  Twin A: Maternal left, cephalic presentation, anterior  placenta, female fetus.  Mild polyhydramnios is seen (maximum  vertical pocket measuring 10.4 cm).  Fetal growth is  appropriate for gestational age.  The estimated fetal weight is  at the 31st percentile.  Fetal anatomical survey appears  normal but limited by advanced gestational age.  Umbilical  artery Doppler showed slightly increased S/D ratio.  Twin B: Maternal right, breech presentation, anterio lateral  placenta, female fetus.  Amniotic fluid is normal and good  fetal activity seen.  The estimated fetal  weight is at less than  the 1st percentile.  Fetal anatomical survey appears normal  but limited by advanced gestational age.  Umbilical artery  Doppler showed normal forward diastolic flow.  Growth discordance: 40% (abnormal).  xxxxxxxxxxxxxxxxxxxxxxxxxxxxxxxxxxxxxxxxxxx  Consultation (from [facility])  Name: Joanne Merritt  DOB: [birth year]  MRN: [id number]  Referring Provider: Doristine Counter, MD  I had the pleasure of seeing Ms. Bouchillon today at the [facility]. She is a G5 P11 with dichorionic-  diamniotic twin pregnancy at 79w 3d gestation and on  ultrasound performed at your office selective fetal growth  restriction of twin B was detected.  Her prenatal care has been, otherwise, uneventful.  She is  yet to undergo screening for gestational diabetes.  Patient  reports she had midtrimester fetal anatomy scan that was  reported as normal.  She had opted not to screen for fetal  aneuploidies.  Obstetric history significant for a term cesarean delivery  (nonreassuring fetal heart trace) in 2018 at [facility]  She delivered a female infant weighing 8 pounds at  birth and her son is in good health.  She also had early miscarriages.  GYN history: No history of abnormal Pap smears or cervical  surgeries. No history of breast disease.  Her previous cycles  were reportedly regular.  Past medical history: Childhood asthma.  No history of  diabetes or hypertension or sickle cell trait.  Past surgical history: D&C, cesarean section.  Medications: Prenatal vitamins, vitamin D.  Allergies: Penicillin (hives).  Social history: Denies tobacco drug or alcohol use.  Her  partner is African-American and he is in good health he is  also the father of her first child.  Family history: No history of venous thromboembolism in the  family.  I counseled the patient on the following:  Dichorionic-diamniotic twin pregnancy with selective fetal  growth restriction (twin B)  -Explained the chorionicity with diagrams  and the usually  favorable outcome of one or both twins and dichorionic twin  gestation.  -Patient has selective severe fetal growth restriction of twin B.  -Discussed the possible causes of fetal growth restriction  including placental insufficiency (most-common cause), fetal  infections (unlikely), fetal chromosomal anomalies or genetic  conditions.  -Discussed the significance and frequency of antenatal  testing to detect fetal compromise and plan delivery.  Weekly  antenatal testing is recommended from now till delivery.  -I informed her that only amniocentesis will be able to  determine the fetal karyotype or genetic conditions  Doctor, general practice).  I explained amniocentesis procedure and  possible complications including preterm delivery or PPROM  in 1 and 500 procedures.  Patient opted not to have  amniocentesis.  -Our goal is to prolong the pregnancy with frequent antenatal  testing.  -Timing of delivery would be based on interval growth and  antenatal testing.  -If good interval growth is seen in twin B we would  recommend delivery at [redacted] weeks gestation provided  antenatal testing remains reassuring.  However, if poor  interval growth is seen or if antenatal testing is abnormal  earlier delivery would be recommended.  -Patient understands that preterm delivery of twins for growth  restriction in one twin places the other healthy twin at risk of  prematurity complications.  -I informed her fetal growth restriction may proceed  development of preeclampsia in some cases.  Blood  pressure today at our office was 121/83 mmHg.  -I discussed the role of antenatal corticosteroids for fetal  pulmonary maturity that are preferably given when delivery is  anticipated.  Recommendations:  -Weekly umbilical artery Doppler to continue.  -NST with Doppler studies  -Fetal growth assessment in 3 weeks.  -BPP from 31 to [redacted] weeks gestation.  -Delivery at [redacted] weeks gestation or earlier as indicated.  -Gestational diabetes screening  now as antenatal  corticosteroids may be indicated.  Thank you for your consultation if you have any questions or  concerns please do not hesitate to contact me at the [facility].  Consultation including face-to-face counseling: 40 minutes. ----------------------------------------------------------------------                  Noralee Space, MD Electronically Signed Final Report   04/20/2020 06:03 pm ----------------------------------------------------------------------  [place] MFM UA CORD DOPPLER  Result Date: 05/11/2020 ----------------------------------------------------------------------  OBSTETRICS REPORT                         (Signed Final 05/11/2020 03:59 pm) ---------------------------------------------------------------------- Patient Info  ID #:        [id number]                          D.O.B.:  June 25, 1986 (34 yrs)  Name:        Joanne Merritt                 Visit Date: 05/11/2020 12:53 pm ---------------------------------------------------------------------- Performed By  Attending:         Lin Landsman      Ref. Address:      [address]                     [place]                                        [phone]  Performed By:      Tommie Raymond BS,       [facility]                                 [facility]  Referred By:       Yehuda Mao MD ---------------------------------------------------------------------- Orders  #   Description                          Code         Ordered By  1   [place] MFM UA CORD DOPPLER               76820.02     RAVI SHANKAR  2   [place] MFM FETAL BPP W/NONSTRESS         81191.4      RAVI SHANKAR  3   [place] MFM OB FOLLOW UP                  78295.62     RAVI SHANKAR  4   [place] MFM OB FOLLOW UP ADDL  16109.60     RAVI SHANKAR      GEST  5   [place] MFM FETAL BPP W/NONSTRESS         45409.8      RAVI SHANKAR      ADD'L GEST  6   [place] MFM UA DOPPLER ADDL GEST          76820.03      RAVI SHANKAR      RE EVAL ----------------------------------------------------------------------  #   Order #                    Accession #                 Episode #  1   [id number]                  [id number]  2   [id number]                  [id number]                  [id number]  3   [id number]                  [id number]                  [id number]  4   [id number]                  [id number]                  [id number]  5   [id number]                  [id number]                  [id number]  6   [id number]                  [id number]                  [id number] ---------------------------------------------------------------------- Indications  [redacted] weeks gestation of pregnancy                 Z3A.31  Maternal care for known or suspected poor       O36.5932  fetal growth, third trimester, fetus 2 IUGR  Twin pregnancy, di/di, third trimester          O30.043  Encounter for other antenatal screening         Z36.2  follow-up  Advanced maternal age multigravida 93+, third   O32.523  trimester  Polyhydramnios, third trimester, antepartum     O40.3XX1  condition or complication, fetus 1  Obesity complicating pregnancy, third trimester O99.213  Previous cesarean delivery, antepartum x 1      O34.219  Declined genetic testing ---------------------------------------------------------------------- Fetal Evaluation (Fetus A)  Num Of Fetuses:          2  Fetal Heart Rate(bpm):   143  Cardiac Activity:        Observed  Fetal Lie:               Maternal left side  Presentation:            Cephalic  Placenta:                Anterior Fundal  P. Cord Insertion:  Previously Visualized  Membrane Desc:       Dividing Membrane seen - Dichorionic.  Amniotic Fluid  AFI FV:      Subjectively upper-normal                              Largest Pocket(cm)                              6.6 ---------------------------------------------------------------------- Biophysical Evaluation (Fetus A)  Amniotic F.V:   Pocket => 2 cm               F. Tone:         Observed  F. Movement:    Observed                    Score:           6/8  F. Breathing:   Not Observed ---------------------------------------------------------------------- Biometry (Fetus A)  BPD:      78.4   mm     G. Age:  31w 3d         41  %    CI:         82.66  %    70 - 86                                                           FL/HC:       21.0  %    19.3 - 21.3  HC:        272   mm     G. Age:  29w 5d        < 1  %    HC/AC:       1.00       0.96 - 1.17  AC:      272.1   mm     G. Age:  31w 2d         43  %    FL/BPD:      73.0  %    71 - 87  FL:       57.2   mm     G. Age:  30w 0d          8  %    FL/AC:       21.0  %    20 - 24  CER:      42.4   mm     G. Age:  36w 3d       > 95  %  LV:         3.9  mm  CM:         5.8  mm  Est. FW:    1629   gm      3 lb 9 oz     19  %     FW Discordancy     0 \ 37  % ---------------------------------------------------------------------- OB History  Gravidity:     5         Term:  1          Prem:  0        SAB:   2  TOP:           0       Ectopic: 1         Living: 1 ---------------------------------------------------------------------- Gestational Age (Fetus A)  LMP:            31w 3d       Date:  10/04/19                   EDD:  07/10/20  U/S Today:      30w 4d                                         EDD:  07/16/20  Best:           31w 3d    Det. By:  LMP  (10/04/19)            EDD:  07/10/20 ---------------------------------------------------------------------- Anatomy (Fetus A)  Cranium:                Appears normal         LVOT:                   Not well visualized  Cavum:                  Appears normal         Aortic Arch:            Not well visualized  Ventricles:             Appears normal         Ductal Arch:            Not well visualized  Choroid Plexus:         Previously seen        Diaphragm:              Appears normal  Cerebellum:             Appears normal         Stomach:                Appears normal, left                                                                          sided  Posterior Fossa:        Not well visualized    Abdomen:                Appears normal  Nuchal Fold:            Not applicable (>20    Abdominal Wall:         Appears nml (cord                          wks GA)                                        insert, abd wall)  Face:  Appears normal         Cord Vessels:           Appears normal (3                          (orbits and profile)                           vessel cord)  Lips:                   Appears normal         Kidneys:                Appear normal  Palate:                 Not well visualized    Bladder:                Appears normal  Thoracic:               Appears normal         Spine:                  Ltd views no                                                                         intracranial signs of                                                                         NTD  Heart:                  Appears normal         Upper Extremities:      Visualized                          (4CH, axis, and situs)  RVOT:                   Not well visualized    Lower Extremities:      Visualized  Other:   Fetus appears to be a female. Technically difficult due to advanced           gestational age. ---------------------------------------------------------------------- Doppler - Fetal Vessels (Fetus A)  Umbilical Artery    S/D     %tile      RI                                      ADFV    RDFV   4.13        97   0.76  No      No ---------------------------------------------------------------------- Fetal Evaluation (Fetus B)  Num Of Fetuses:          2  Fetal Heart Rate(bpm):   136  Cardiac Activity:        Observed  Fetal Lie:               Maternal right side  Presentation:            Oblique/ Trv, head to maternal right  Placenta:                Anterior  P. Cord Insertion:       Previously Visualized  Membrane Desc:       Dividing  Membrane seen - Dichorionic.  Amniotic Fluid  AFI FV:      Polyhydramnios                              Largest Pocket(cm)                              9.1 ---------------------------------------------------------------------- Biophysical Evaluation (Fetus B)  Amniotic F.V:   Pocket => 2 cm              F. Tone:         Observed  F. Movement:    Observed                    Score:           8/8  F. Breathing:   Observed ---------------------------------------------------------------------- Biometry (Fetus B)  BPD:      77.9   mm     G. Age:  31w 2d         34  %    CI:         85.71  %    70 - 86                                                           FL/HC:       18.1  %    19.3 - 21.3  HC:        265   mm     G. Age:  28w 6d        < 1  %    HC/AC:       1.18       0.96 - 1.17  AC:      224.9   mm     G. Age:  26w 6d        < 1  %    FL/BPD:      61.6  %    71 - 87  FL:         48   mm     G. Age:  26w 1d        < 1  %    FL/AC:       21.3  %    20 - 24  CER:      37.5   mm     G. Age:  32w 2d         61  %  LV:         8.9  mm  CM:         5.9  mm  Est. FW:    1019   gm      2 lb 4 oz    < 1  %     FW Discordancy        37   % ---------------------------------------------------------------------- Gestational Age (Fetus B)  LMP:            31w 3d       Date:  10/04/19                   EDD:  07/10/20  U/S Today:      28w 2d                                         EDD:  08/01/20  Best:           31w 3d    Det. By:  LMP  (10/04/19)            EDD:  07/10/20 ---------------------------------------------------------------------- Anatomy (Fetus B)  Cranium:                Appears normal         LVOT:                   Not well visualized  Cavum:                  Appears normal         Aortic Arch:            Not well visualized  Ventricles:             Appears normal         Ductal Arch:            Not well visualized  Choroid Plexus:         Appears normal         Diaphragm:              Appears normal  Cerebellum:              Appears normal         Stomach:                Appears normal, left                                                                         sided  Posterior Fossa:        Not well visualized    Abdomen:                Appears normal  Nuchal Fold:            Not applicable (>20    Abdominal Wall:         Appears nml (cord                          wks GA)  insert, abd wall)  Face:                   Appears normal         Cord Vessels:           Previously seen                          (orbits and profile)  Lips:                   Appears normal         Kidneys:                Appear normal  Palate:                 Not well visualized    Bladder:                Appears normal  Thoracic:               Appears normal         Spine:                  Limited views                                                                         previously seen  Heart:                  Appears normal         Upper Extremities:      Visualized                          (4CH, axis, and situs)  RVOT:                   Not well visualized    Lower Extremities:      Visualized  Other:   Fetus appears to be female. Technically difficult due to advanced GA           and fetal position. ---------------------------------------------------------------------- Doppler - Fetal Vessels (Fetus B)  Umbilical Artery    S/D     %tile                                              ADFV    RDFV   3.17        75                                                 No      No ---------------------------------------------------------------------- Cervix Uterus Adnexa  Cervix  Not visualized (advanced GA >24wks)  Uterus  No abnormality visualized.  Right Ovary  Within normal limits. No adnexal mass visualized.  Left Ovary  Within normal limits. No adnexal mass visualized.  Cul De Sac  No free fluid seen.  Adnexa  No abnormality visualized. ----------------------------------------------------------------------  Impression  Follow up diamniotic dichorionic twin growth with IUGR of Twin  B.  There is positive interval growth however, interval growth of  Twin B significantly lags Twin with current discordance at 37%.  Twin A BPP 6/8 with elevated UA Dopplers, normal amniotic fluid  and movement, NST reactive  Twin B BPP 8/8 with normal UA Dopplers, normal amniotic fluid  and movement, NST reactive with a prolonged deceleration  Suboptimal views of the fetal anatomy was again observed in  both Twins.  Ms. Moster conveyed that she feels good fetal movement from  both Twins daily.  I discussed today's visit with Ms. Joanne Merritt and reviewed  today's findings of Twin B with a prolonged deceleration during  40 minutes of monitoring. I recommended further evaluation and  monitoring at the [facility].  I spoke to Dr. Mindi Slicker who referred me to her colleague on call. I  left them a message. ---------------------------------------------------------------------- Recommendations  To [facility]U for further monitoring  Continue weekly testing with UA Dopplers and NST's given  abnormal Dopplers of Twin A and fetal growth discordance.  Consider delivery between 35-37 weeks given twin pregnancy  abnormal Dopplers and discordance. ( she is scheduled for 36  weeks for 06/13/20 ----------------------------------------------------------------------               Lin Landsman, MD Electronically Signed Final Report   05/11/2020 03:59 pm ----------------------------------------------------------------------  [place] MFM UA CORD DOPPLER  Result Date: 05/04/2020 ----------------------------------------------------------------------  OBSTETRICS REPORT                       (Signed Final 05/04/2020 03:08 pm) ---------------------------------------------------------------------- Patient Info  ID #:       [id number]                          D.O.B.:  [birth year] (34 yrs)  Name:       Joanne Merritt                 Visit Date: 05/04/2020 01:18 pm  ---------------------------------------------------------------------- Performed By  Attending:        Ma Rings MD         Ref. Address:     [address]                                                             [address]  Performed By:     Percell Boston          [facility]                                     [facility]  Referred By:      Yehuda Mao MD ---------------------------------------------------------------------- Orders  #  Description  Code        Ordered By  1  [place] MFM UA CORD DOPPLER                [id number]    RAVI SHANKAR  2  [place] MFM UA DOPPLER ADDL                76820.03    RAVI SHANKAR     GEST RE EVAL ----------------------------------------------------------------------  #  Order #                     Accession #                Episode #  1  [id number]                   [id number]                 [id number]  2  [id number]                   [id number]                 [id number] ---------------------------------------------------------------------- Indications  Maternal care for known or suspected poor      O36.5932  fetal growth, third trimester, fetus 2 IUGR  Twin pregnancy, di/di, third trimester         O30.043  [redacted] weeks gestation of pregnancy                Z3A.30  Encounter for other antenatal screening        Z36.2  follow-up  Advanced maternal age multigravida 56+,        O88.523  third trimester  Polyhydramnios, third trimester, antepartum    O40.3XX1  condition or complication, fetus 1  Obesity complicating pregnancy, third          O99.213  trimester  Previous cesarean delivery, antepartum x 1     O34.219  Declined genetic testing ---------------------------------------------------------------------- Fetal Evaluation (Fetus A)  Num Of Fetuses:         2  Fetal Heart Rate(bpm):  141  Cardiac Activity:       Observed  Fetal Lie:              Maternal left side  Presentation:            Cephalic  Placenta:               Anterior  P. Cord Insertion:      Previously Visualized  Membrane Desc:      Dividing Membrane seen - Dichorionic.  Amniotic Fluid  AFI FV:      Within normal limits                              Largest Pocket(cm)                              3 ---------------------------------------------------------------------- OB History  Gravidity:    5         Term:   1        Prem:   0        SAB:   2  TOP:          0       Ectopic:  1        Living: 1 ---------------------------------------------------------------------- Gestational  Age (Fetus A)  LMP:           30w 3d        Date:  10/04/19                 EDD:   07/10/20  Best:          Georgiann Hahn 3d     Det. By:  LMP  (10/04/19)          EDD:   07/10/20 ---------------------------------------------------------------------- Anatomy (Fetus A)  Thoracic:              Appears normal         Bladder:                Appears normal  Stomach:               Appears normal, left                         sided ---------------------------------------------------------------------- Doppler - Fetal Vessels (Fetus A)  Umbilical Artery   S/D     %tile      RI               PI                     ADFV    RDFV   3.37       79     0.7             1.13                        No      No ---------------------------------------------------------------------- Fetal Evaluation (Fetus B)  Num Of Fetuses:         2  Fetal Heart Rate(bpm):  155  Cardiac Activity:       Observed  Fetal Lie:              Maternal right side  Presentation:           Breech  Placenta:               Anterior Fundal  P. Cord Insertion:      Previously Visualized  Membrane Desc:      Dividing Membrane seen - Dichorionic.  Amniotic Fluid  AFI FV:      Within normal limits                              Largest Pocket(cm)                              3.4 ---------------------------------------------------------------------- Gestational Age (Fetus B)  LMP:           30w 3d        Date:  10/04/19                  EDD:   07/10/20  Best:          Georgiann Hahn 3d     Det. By:  LMP  (10/04/19)          EDD:   07/10/20 ---------------------------------------------------------------------- Anatomy (Fetus B)  Thoracic:              Appears normal         Bladder:  Appears normal  Stomach:               Appears normal, left                         sided ---------------------------------------------------------------------- Doppler - Fetal Vessels (Fetus B)  Umbilical Artery   S/D     %tile      RI               PI                     ADFV    RDFV   3.16       70    0.68             1.13                        No      No ---------------------------------------------------------------------- Comments  This patient was seen due to IUGR of twin B in a dichorionic,  diamniotic twin gestation.  She denies any problems since  her last exam and reports feeling vigorous fetal movements  x2.  Doppler studies of the umbilical arteries performed for both  twin A and twin B continue to show a normal S/D ratio in both  fetuses.  There were no signs of absent or reversed end-  diastolic flow noted in either fetus today.  The amniotic fluid level appeared within normal limits for both  fetuses.  The patient also had a reactive nonstress test for both twin A  and twin B today.  Due to fetal growth restriction of twin B, we will continue to  follow her with weekly fetal testing.  Another Doppler study and growth scan was scheduled in 1  week. ----------------------------------------------------------------------                   Ma Rings, MD Electronically Signed Final Report   05/04/2020 03:08 pm ----------------------------------------------------------------------  [place] MFM UA CORD DOPPLER  Result Date: 04/27/2020 ----------------------------------------------------------------------  OBSTETRICS REPORT                       (Signed Final 04/27/2020 03:15 pm) ----------------------------------------------------------------------  Patient Info  ID #:       [id number]                          D.O.B.:  1986-07-21 (34 yrs)  Name:       Joanne Merritt                 Visit Date: 04/27/2020 01:15 pm ---------------------------------------------------------------------- Performed By  Attending:        Noralee Space MD        Ref. Address:     [address]                                                             [address]  Performed By:     Lenise Arena        [facility]                                     [facility]  Referred By:      Yehuda Mao MD ---------------------------------------------------------------------- Orders  #  Description                           Code        Ordered By  1  [place] MFM UA CORD DOPPLER                76820.02    RAVI SHANKAR  2  [place] MFM UA DOPPLER ADDL                76820.03    RAVI SHANKAR     GEST RE EVAL ----------------------------------------------------------------------  #  Order #                     Accession #                Episode #  1  [id number]                   [id number]                 [id number]  2  [id number]                   [id number]                 [id number] ---------------------------------------------------------------------- Indications  Maternal care for known or suspected poor      O36.5932  fetal growth, third trimester, fetus 2 IUGR  Encounter for antenatal screening for          Z36.3  malformations  Twin pregnancy, di/di, third trimester         O30.043  [redacted] weeks gestation of pregnancy                Z3A.42  Advanced maternal age multigravida 41+,        O12.523  third trimester  Polyhydramnios, third trimester, antepartum    O40.3XX1  condition or complication, fetus 1  Obesity complicating pregnancy, third          O99.213  trimester  Previous cesarean delivery, antepartum x 1     O34.219  Declined genetic testing ---------------------------------------------------------------------- Fetal Evaluation (Fetus  A)  Num Of Fetuses:         2  Fetal Heart Rate(bpm):  153  Cardiac Activity:       Observed  Fetal Lie:              Maternal left side  Presentation:           Cephalic  Placenta:               Anterior  Amniotic Fluid  AFI FV:      Within normal limits                              Largest Pocket(cm)  5.6 ---------------------------------------------------------------------- OB History  Gravidity:    5         Term:   1        Prem:   0        SAB:   2  TOP:          0       Ectopic:  1        Living: 1 ---------------------------------------------------------------------- Gestational Age (Fetus A)  LMP:           29w 3d        Date:  10/04/19                 EDD:   07/10/20  Best:          29w 3d     Det. By:  LMP  (10/04/19)          EDD:   07/10/20 ---------------------------------------------------------------------- Anatomy (Fetus A)  Stomach:               Appears normal, left   Bladder:                Appears normal                         sided ---------------------------------------------------------------------- Doppler - Fetal Vessels (Fetus A)  Umbilical Artery   S/D     %tile   3.45       78 ---------------------------------------------------------------------- Fetal Evaluation (Fetus B)  Num Of Fetuses:         2  Fetal Heart Rate(bpm):  151  Cardiac Activity:       Observed  Fetal Lie:              Maternal right side  Presentation:           Oblique  Placenta:               Anterior  Amniotic Fluid  AFI FV:      Within normal limits                              Largest Pocket(cm)                              4.8 ---------------------------------------------------------------------- Gestational Age (Fetus B)  LMP:           29w 3d        Date:  10/04/19                 EDD:   07/10/20  Best:          29w 3d     Det. By:  LMP  (10/04/19)          EDD:   07/10/20 ---------------------------------------------------------------------- Anatomy (Fetus B)  Stomach:                Appears normal, left   Bladder:                Appears normal                         sided ---------------------------------------------------------------------- Doppler - Fetal Vessels (Fetus B)  Umbilical Artery   S/D     %tile   3.99       92 ---------------------------------------------------------------------- Cervix Uterus Adnexa  Cervix  Not visualized (advanced GA >24wks) ---------------------------------------------------------------------- Impression  Dichorionic-diamniotic twin pregnancy with selective fetal  growth restriction of twin B (less than the 1st percentile).  Patient returned for antenatal testing.  Twin A: Maternal left, cephalic presentation, anterior  placenta.  Amniotic fluid is normal and good fetal activity  seen.  Umbilical artery Doppler study showed normal forward  diastolic flow.  NST is reactive.  Twin B: Maternal right, oblique lie, anterior placenta. Amniotic  fluid is normal and good fetal activity seen.  Umbilical artery  Doppler study showed normal forward diastolic flow.  NST is  reactive.  We reassured the patient of the findings. ---------------------------------------------------------------------- Recommendations  -UA Doppler and NST next week. ----------------------------------------------------------------------                  Noralee Space, MD Electronically Signed Final Report   04/27/2020 03:15 pm ----------------------------------------------------------------------  [place] MFM UA CORD DOPPLER  Result Date: 04/20/2020 ----------------------------------------------------------------------  OBSTETRICS REPORT                       (Signed Final 04/20/2020 06:03 pm) ---------------------------------------------------------------------- Patient Info  ID #:       [id number]                          D.O.B.:  [birth year] (34 yrs)  Name:       Joanne Merritt                 Visit Date: 04/20/2020 10:18 am ----------------------------------------------------------------------  Performed By  Attending:        Noralee Space MD        Ref. Address:     [address]                                                             [id number]  Performed By:     Percell Boston          [facility]                                     [facility]  Referred By:      Yehuda Mao MD ---------------------------------------------------------------------- Orders  #  Description                           Code        Ordered By  1  [place] MFM OB DETAIL +14 WK               76811.01    CECILIA BANGA  2  [place] MFM OB DETAIL ADDL GEST            40981.19    CECILIA BANGA     +14 WK  3  [place] MFM UA CORD DOPPLER  16109.60    CECILIA BANGA  4  [place] MFM UA ADDL GEST                   76820.01    CECILIA BANGA ----------------------------------------------------------------------  #  Order #                     Accession #                Episode #  1  [id number]                   [id number]                 [id number]  2  [id number]                   [id number]                 [id number]  3  [id number]                   [id number]                 [id number]  4  [id number]                   [id number]                 [id number] ---------------------------------------------------------------------- Indications  Maternal care for known or suspected poor      O36.5932  fetal growth, third trimester, fetus 2 IUGR  [redacted] weeks gestation of pregnancy                Z3A.28  Encounter for antenatal screening for          Z36.3  malformations  Twin pregnancy, di/di, third trimester         O48.043  Advanced maternal age multigravida 82+,        O59.523  third trimester  Polyhydramnios, third trimester, antepartum    O40.3XX1  condition or complication, fetus 1  Obesity complicating pregnancy, third          O99.213  trimester  Previous cesarean delivery, antepartum x 1     O34.219  Declined genetic testing  ---------------------------------------------------------------------- Fetal Evaluation (Fetus A)  Num Of Fetuses:         2  Fetal Heart Rate(bpm):  132  Cardiac Activity:       Observed  Fetal Lie:              Maternal left side  Presentation:           Cephalic  Placenta:               Anterior  P. Cord Insertion:      Visualized, central  Membrane Desc:      Dividing Membrane seen - Dichorionic.  Amniotic Fluid  AFI FV:      Polyhydramnios                              Largest Pocket(cm)                              10.4 ---------------------------------------------------------------------- Biometry (Fetus A)  BPD:      73.8  mm     G. Age:  29w 4d         75  %  CI:        84.18   %    70 - 86                                                          FL/HC:      20.2   %    18.8 - 20.6  HC:      253.5  mm     G. Age:  27w 4d        4.8  %    HC/AC:      1.03        1.05 - 1.21  AC:      245.4  mm     G. Age:  28w 6d         54  %    FL/BPD:     69.4   %    71 - 87  FL:       51.2  mm     G. Age:  27w 3d         11  %    FL/AC:      20.9   %    20 - 24  HUM:      48.1  mm     G. Age:  28w 1d         41  %  Est. FW:    1199  gm    2 lb 10 oz      31  %     FW Discordancy     0 \ 40 % ---------------------------------------------------------------------- OB History  Gravidity:    5         Term:   1        Prem:   0        SAB:   2  TOP:          0       Ectopic:  1        Living: 1 ---------------------------------------------------------------------- Gestational Age (Fetus A)  LMP:           28w 3d        Date:  10/04/19                 EDD:   07/10/20  U/S Today:     28w 3d                                        EDD:   07/10/20  Best:          28w 3d     Det. By:  LMP  (10/04/19)          EDD:   07/10/20 ---------------------------------------------------------------------- Anatomy (Fetus A)  Cranium:               Appears normal         LVOT:                   Not well visualized  Cavum:                  Appears normal         Aortic Arch:  Not well visualized  Ventricles:            Appears normal         Ductal Arch:            Not well visualized  Choroid Plexus:        Appears normal         Diaphragm:              Appears normal  Cerebellum:            Not well visualized    Stomach:                Appears normal, left                                                                        sided  Posterior Fossa:       Not well visualized    Abdomen:                Appears normal  Nuchal Fold:           Not applicable (>20    Abdominal Wall:         Not well visualized                         wks GA)  Face:                  Appears normal         Cord Vessels:           Appears normal (3                         (orbits and profile)                           vessel cord)  Lips:                  Appears normal         Kidneys:                Appear normal  Palate:                Not well visualized    Bladder:                Appears normal  Thoracic:              Appears normal         Spine:                  Ltd views no                                                                        intracranial signs of  NTD  Heart:                 Not well visualized    Upper Extremities:      Visualized  RVOT:                  Appears normal         Lower Extremities:      Visualized  Other:  Fetus appears to be a female. Technically difficult due to advanced          gestational age. ---------------------------------------------------------------------- Doppler - Fetal Vessels (Fetus A)  Umbilical Artery   S/D     %tile      RI               PI                     ADFV    RDFV   2.97       50    0.66             1.07                         N       N ---------------------------------------------------------------------- Fetal Evaluation (Fetus B)  Num Of Fetuses:         2  Fetal Heart Rate(bpm):  150  Cardiac Activity:       Observed  Fetal  Lie:              Maternal right side  Presentation:           Breech  Placenta:               Anterior  P. Cord Insertion:      Visualized, central  Membrane Desc:      Dividing Membrane seen - Dichorionic.  Amniotic Fluid  AFI FV:      Within normal limits                              Largest Pocket(cm)                              4.7 ---------------------------------------------------------------------- Biometry (Fetus B)  BPD:      70.1  mm     G. Age:  28w 1d         29  %    CI:        85.55   %    70 - 86                                                          FL/HC:      17.7   %    18.8 - 20.6  HC:      238.7  mm     G. Age:  26w 0d        < 1  %    HC/AC:      1.19        1.05 - 1.21  AC:       201   mm     G. Age:  24w 5d        <  1  %    FL/BPD:     60.2   %    71 - 87  FL:       42.2  mm     G. Age:  23w 5d        < 1  %    FL/AC:      21.0   %    20 - 24  HUM:      39.3  mm     G. Age:  24w 0d        < 5  %  Est. FW:     720  gm      1 lb 9 oz    < 1  %     FW Discordancy        40  % ---------------------------------------------------------------------- Gestational Age (Fetus B)  LMP:           28w 3d        Date:  10/04/19                 EDD:   07/10/20  U/S Today:     25w 5d                                        EDD:   07/29/20  Best:          28w 3d     Det. By:  LMP  (10/04/19)          EDD:   07/10/20 ---------------------------------------------------------------------- Anatomy (Fetus B)  Cranium:               Appears normal         LVOT:                   Not well visualized  Cavum:                 Appears normal         Aortic Arch:            Not well visualized  Ventricles:            Appears normal         Ductal Arch:            Not well visualized  Choroid Plexus:        Appears normal         Diaphragm:              Appears normal  Cerebellum:            Not well visualized    Stomach:                Appears normal, left                                                                         sided  Posterior Fossa:       Not well visualized    Abdomen:                Appears normal  Nuchal Fold:  Not applicable (>20    Abdominal Wall:         Not well visualized                         wks GA)  Face:                  Appears normal         Cord Vessels:           Appears normal (3                         (orbits and profile)                           vessel cord)  Lips:                  Appears normal         Kidneys:                Appear normal  Palate:                Not well visualized    Bladder:                Appears normal  Thoracic:              Appears normal         Spine:                  Ltd views no                                                                        intracranial signs of                                                                        NTD  Heart:                 Appears normal         Upper Extremities:      Visualized                         (4CH, axis, and                         situs)  RVOT:                  Not well visualized    Lower Extremities:      Visualized ---------------------------------------------------------------------- Doppler - Fetal Vessels (Fetus B)  Umbilical Artery   S/D     %tile      RI               PI  ADFV    RDFV   4.45       96    0.78             1.36                         N       N ---------------------------------------------------------------------- Cervix Uterus Adnexa  Cervix  Not visualized (advanced GA >24wks)  Uterus  No abnormality visualized.  Right Ovary  No adnexal mass visualized.  Left Ovary  No adnexal mass visualized.  Cul De Sac  No free fluid seen.  Adnexa  No abnormality visualized. ---------------------------------------------------------------------- Impression  On ultrasound, we confirmed dichorionic-diamniotic twin  pregnancy.  Twin A: Maternal left, cephalic presentation, anterior  placenta, female fetus.  Mild polyhydramnios is seen (maximum  vertical pocket measuring  10.4 cm).  Fetal growth is  appropriate for gestational age.  The estimated fetal weight is  at the 31st percentile.  Fetal anatomical survey appears  normal but limited by advanced gestational age.  Umbilical  artery Doppler showed slightly increased S/D ratio.  Twin B: Maternal right, breech presentation, anterio lateral  placenta, female fetus.  Amniotic fluid is normal and good  fetal activity seen.  The estimated fetal weight is at less than  the 1st percentile.  Fetal anatomical survey appears normal  but limited by advanced gestational age.  Umbilical artery  Doppler showed normal forward diastolic flow.  Growth discordance: 40% (abnormal).  xxxxxxxxxxxxxxxxxxxxxxxxxxxxxxxxxxxxxxxxxxx  Consultation (from [facility])  Name: Joanne Merritt  DOB: 01-06-1986  MRN: [id number]  Referring Provider: Doristine Counter, MD  I had the pleasure of seeing Ms. Fyfe today at the [facility]. She is a G5 P60 with dichorionic-  diamniotic twin pregnancy at 89w 3d gestation and on  ultrasound performed at your office selective fetal growth  restriction of twin B was detected.  Her prenatal care has been, otherwise, uneventful.  She is  yet to undergo screening for gestational diabetes.  Patient  reports she had midtrimester fetal anatomy scan that was  reported as normal.  She had opted not to screen for fetal  aneuploidies.  Obstetric history significant for a term cesarean delivery  (nonreassuring fetal heart trace) in 2018 at [facility].  She delivered a female infant weighing 8 pounds at  birth and her son is in good health.  She also had early miscarriages.  GYN history: No history of abnormal Pap smears or cervical  surgeries. No history of breast disease.  Her previous cycles  were reportedly regular.  Past medical history: Childhood asthma.  No history of  diabetes or hypertension or sickle cell trait.  Past surgical history: D&C, cesarean section.  Medications: Prenatal vitamins, vitamin D.   Allergies: Penicillin (hives).  Social history: Denies tobacco drug or alcohol use.  Her  partner is African-American and he is in good health he is  also the father of her first child.  Family history: No history of venous thromboembolism in the  family.  I counseled the patient on the following:  Dichorionic-diamniotic twin pregnancy with selective fetal  growth restriction (twin B)  -Explained the chorionicity with diagrams and the usually  favorable outcome of one or both twins and dichorionic twin  gestation.  -Patient has selective severe fetal growth restriction of twin B.  -Discussed the possible causes of fetal growth restriction  including placental insufficiency (most-common cause), fetal  infections (unlikely), fetal  chromosomal anomalies or genetic  conditions.  -Discussed the significance and frequency of antenatal  testing to detect fetal compromise and plan delivery.  Weekly  antenatal testing is recommended from now till delivery.  -I informed her that only amniocentesis will be able to  determine the fetal karyotype or genetic conditions  Doctor, general practice).  I explained amniocentesis procedure and  possible complications including preterm delivery or PPROM  in 1 and 500 procedures.  Patient opted not to have  amniocentesis.  -Our goal is to prolong the pregnancy with frequent antenatal  testing.  -Timing of delivery would be based on interval growth and  antenatal testing.  -If good interval growth is seen in twin B we would  recommend delivery at [redacted] weeks gestation provided  antenatal testing remains reassuring.  However, if poor  interval growth is seen or if antenatal testing is abnormal  earlier delivery would be recommended.  -Patient understands that preterm delivery of twins for growth  restriction in one twin places the other healthy twin at risk of  prematurity complications.  -I informed her fetal growth restriction may proceed  development of preeclampsia in some cases.  Blood  pressure today  at our office was 121/83 mmHg.  -I discussed the role of antenatal corticosteroids for fetal  pulmonary maturity that are preferably given when delivery is  anticipated.  Recommendations:  -Weekly umbilical artery Doppler to continue.  -NST with Doppler studies  -Fetal growth assessment in 3 weeks.  -BPP from 31 to [redacted] weeks gestation.  -Delivery at [redacted] weeks gestation or earlier as indicated.  -Gestational diabetes screening now as antenatal  corticosteroids may be indicated.  Thank you for your consultation if you have any questions or  concerns please do not hesitate to contact me at the [facility].  Consultation including face-to-face counseling: 40 minutes. ----------------------------------------------------------------------                  Noralee Space, MD Electronically Signed Final Report   04/20/2020 06:03 pm ----------------------------------------------------------------------  [place] MFM UA DOPPLER ADDL GEST RE EVAL  Result Date: 05/11/2020 ----------------------------------------------------------------------  OBSTETRICS REPORT                         (Signed Final 05/11/2020 03:59 pm) ---------------------------------------------------------------------- Patient Info  ID #:        [id number]                          D.O.B.:  [birth year] (34 yrs)  Name:        Joanne Merritt                 Visit Date: 05/11/2020 12:53 pm ---------------------------------------------------------------------- Performed By  Attending:         Lin Landsman      Ref. Address:      [address]                     [place]                                        [phone]  Performed By:      Tommie Raymond BS,       [facility]                                 [facility]etal [facility]  Referred By:       Yehuda Mao MD ---------------------------------------------------------------------- Orders  #   Description                          Code          Ordered By  1   [place] MFM UA CORD DOPPLER               76820.02     RAVI SHANKAR  2   [place] MFM FETAL BPP W/NONSTRESS         16109.6      RAVI SHANKAR  3   [place] MFM OB FOLLOW UP                  76816.01     RAVI SHANKAR  4   [place] MFM OB FOLLOW UP ADDL             04540.98     RAVI SHANKAR      GEST  5   [place] MFM FETAL BPP W/NONSTRESS         11914.7      RAVI SHANKAR      ADD'L GEST  6   [place] MFM UA DOPPLER ADDL GEST          76820.03     RAVI SHANKAR      RE EVAL ----------------------------------------------------------------------  #   Order #                    Accession #                 Episode #  1   [id number]                  [id number]                  [id number]  2   [id number]                  [id number]                  [id number]  3   [id number]                  [id number]                  [id number]  4   [id number]                  [id number]                  [id number]  5   [id number]                  [id number]                  [id number]  6   [id number]                  [id number]                  [id number] ---------------------------------------------------------------------- Indications  [redacted] weeks gestation of  pregnancy                 Z3A.31  Maternal care for known or suspected poor       O36.5932  fetal growth, third trimester, fetus 2 IUGR  Twin pregnancy, di/di, third trimester          O30.043  Encounter for other antenatal screening         Z36.2  follow-up  Advanced maternal age multigravida 80+, third   O50.523  trimester  Polyhydramnios, third trimester, antepartum     O40.3XX1  condition or complication, fetus 1  Obesity complicating pregnancy, third trimester O99.213  Previous cesarean delivery, antepartum x 1      O34.219  Declined genetic testing ---------------------------------------------------------------------- Fetal Evaluation (Fetus A)  Num Of Fetuses:          2  Fetal Heart Rate(bpm):   143  Cardiac Activity:        Observed  Fetal Lie:               Maternal left side  Presentation:             Cephalic  Placenta:                Anterior Fundal  P. Cord Insertion:       Previously Visualized  Membrane Desc:       Dividing Membrane seen - Dichorionic.  Amniotic Fluid  AFI FV:      Subjectively upper-normal                              Largest Pocket(cm)                              6.6 ---------------------------------------------------------------------- Biophysical Evaluation (Fetus A)  Amniotic F.V:   Pocket => 2 cm              F. Tone:         Observed  F. Movement:    Observed                    Score:           6/8  F. Breathing:   Not Observed ---------------------------------------------------------------------- Biometry (Fetus A)  BPD:      78.4   mm     G. Age:  31w 3d         41  %    CI:         82.66  %    70 - 86                                                           FL/HC:       21.0  %    19.3 - 21.3  HC:        272   mm     G. Age:  29w 5d        < 1  %    HC/AC:       1.00       0.96 - 1.17  AC:      272.1   mm     G. Age:  31w 2d         43  %    FL/BPD:      73.0  %    71 - 87  FL:       57.2   mm     G. Age:  30w 0d          8  %    FL/AC:       21.0  %    20 - 24  CER:      42.4   mm     G. Age:  36w 3d       > 95  %  LV:         3.9  mm  CM:         5.8  mm  Est. FW:    1629   gm      3 lb 9 oz     19  %     FW Discordancy     0 \ 37  % ---------------------------------------------------------------------- OB History  Gravidity:     5         Term:  1          Prem:  0        SAB:   2  TOP:           0       Ectopic: 1         Living: 1 ---------------------------------------------------------------------- Gestational Age (Fetus A)  LMP:            31w 3d       Date:  10/04/19                   EDD:  07/10/20  U/S Today:      30w 4d                                         EDD:  07/16/20  Best:           31w 3d    Det. By:  LMP  (10/04/19)            EDD:  07/10/20 ---------------------------------------------------------------------- Anatomy (Fetus A)  Cranium:                 Appears normal         LVOT:                   Not well visualized  Cavum:                  Appears normal         Aortic Arch:            Not well visualized  Ventricles:             Appears normal         Ductal Arch:            Not well visualized  Choroid Plexus:         Previously seen        Diaphragm:              Appears normal  Cerebellum:             Appears normal         Stomach:  Appears normal, left                                                                         sided  Posterior Fossa:        Not well visualized    Abdomen:                Appears normal  Nuchal Fold:            Not applicable (>20    Abdominal Wall:         Appears nml (cord                          wks GA)                                        insert, abd wall)  Face:                   Appears normal         Cord Vessels:           Appears normal (3                          (orbits and profile)                           vessel cord)  Lips:                   Appears normal         Kidneys:                Appear normal  Palate:                 Not well visualized    Bladder:                Appears normal  Thoracic:               Appears normal         Spine:                  Ltd views no                                                                         intracranial signs of                                                                         NTD  Heart:  Appears normal         Upper Extremities:      Visualized                          (4CH, axis, and situs)  RVOT:                   Not well visualized    Lower Extremities:      Visualized  Other:   Fetus appears to be a female. Technically difficult due to advanced           gestational age. ---------------------------------------------------------------------- Doppler - Fetal Vessels (Fetus A)  Umbilical Artery    S/D     %tile      RI                                      ADFV    RDFV   4.13        97   0.76                                           No      No ---------------------------------------------------------------------- Fetal Evaluation (Fetus B)  Num Of Fetuses:          2  Fetal Heart Rate(bpm):   136  Cardiac Activity:        Observed  Fetal Lie:               Maternal right side  Presentation:            Oblique/ Trv, head to maternal right  Placenta:                Anterior  P. Cord Insertion:       Previously Visualized  Membrane Desc:       Dividing Membrane seen - Dichorionic.  Amniotic Fluid  AFI FV:      Polyhydramnios                              Largest Pocket(cm)                              9.1 ---------------------------------------------------------------------- Biophysical Evaluation (Fetus B)  Amniotic F.V:   Pocket => 2 cm              F. Tone:         Observed  F. Movement:    Observed                    Score:           8/8  F. Breathing:   Observed ---------------------------------------------------------------------- Biometry (Fetus B)  BPD:      77.9   mm     G. Age:  31w 2d         34  %    CI:         85.71  %    70 - 86  FL/HC:       18.1  %    19.3 - 21.3  HC:        265   mm     G. Age:  28w 6d        < 1  %    HC/AC:       1.18       0.96 - 1.17  AC:      224.9   mm     G. Age:  26w 6d        < 1  %    FL/BPD:      61.6  %    71 - 87  FL:         48   mm     G. Age:  26w 1d        < 1  %    FL/AC:       21.3  %    20 - 24  CER:      37.5   mm     G. Age:  32w 2d         61  %  LV:         8.9  mm  CM:         5.9  mm  Est. FW:    1019   gm      2 lb 4 oz    < 1  %     FW Discordancy        37   % ---------------------------------------------------------------------- Gestational Age (Fetus B)  LMP:            31w 3d       Date:  10/04/19                   EDD:  07/10/20  U/S Today:      28w 2d                                         EDD:  08/01/20  Best:           31w 3d    Det. By:  LMP  (10/04/19)            EDD:  07/10/20  ---------------------------------------------------------------------- Anatomy (Fetus B)  Cranium:                Appears normal         LVOT:                   Not well visualized  Cavum:                  Appears normal         Aortic Arch:            Not well visualized  Ventricles:             Appears normal         Ductal Arch:            Not well visualized  Choroid Plexus:         Appears normal         Diaphragm:              Appears normal  Cerebellum:  Appears normal         Stomach:                Appears normal, left                                                                         sided  Posterior Fossa:        Not well visualized    Abdomen:                Appears normal  Nuchal Fold:            Not applicable (>20    Abdominal Wall:         Appears nml (cord                          wks GA)                                        insert, abd wall)  Face:                   Appears normal         Cord Vessels:           Previously seen                          (orbits and profile)  Lips:                   Appears normal         Kidneys:                Appear normal  Palate:                 Not well visualized    Bladder:                Appears normal  Thoracic:               Appears normal         Spine:                  Limited views                                                                         previously seen  Heart:                  Appears normal         Upper Extremities:      Visualized                          (4CH, axis, and situs)  RVOT:  Not well visualized    Lower Extremities:      Visualized  Other:   Fetus appears to be female. Technically difficult due to advanced GA           and fetal position. ---------------------------------------------------------------------- Doppler - Fetal Vessels (Fetus B)  Umbilical Artery    S/D     %tile                                              ADFV    RDFV   3.17        75                                                  No      No ---------------------------------------------------------------------- Cervix Uterus Adnexa  Cervix  Not visualized (advanced GA >24wks)  Uterus  No abnormality visualized.  Right Ovary  Within normal limits. No adnexal mass visualized.  Left Ovary  Within normal limits. No adnexal mass visualized.  Cul De Sac  No free fluid seen.  Adnexa  No abnormality visualized. ---------------------------------------------------------------------- Impression  Follow up diamniotic dichorionic twin growth with IUGR of Twin  B.  There is positive interval growth however, interval growth of  Twin B significantly lags Twin with current discordance at 37%.  Twin A BPP 6/8 with elevated UA Dopplers, normal amniotic fluid  and movement, NST reactive  Twin B BPP 8/8 with normal UA Dopplers, normal amniotic fluid  and movement, NST reactive with a prolonged deceleration  Suboptimal views of the fetal anatomy was again observed in  both Twins.  Ms. Fiorenza conveyed that she feels good fetal movement from  both Twins daily.  I discussed today's visit with Ms. Joanne Merritt and reviewed  today's findings of Twin B with a prolonged deceleration during  40 minutes of monitoring. I recommended further evaluation and  monitoring at the MAU.  I spoke to Dr. Mindi Slicker who referred me to her colleague on call. I  left them a message. ---------------------------------------------------------------------- Recommendations  To MAU for further monitoring  Continue weekly testing with UA Dopplers and NST's given  abnormal Dopplers of Twin A and fetal growth discordance.  Consider delivery between 35-37 weeks given twin pregnancy  abnormal Dopplers and discordance. ( she is scheduled for 36  weeks for 06/13/20 ----------------------------------------------------------------------               Lin Landsman, MD Electronically Signed Final Report   05/11/2020 03:59 pm ----------------------------------------------------------------------  [place]  MFM UA DOPPLER ADDL GEST RE EVAL  Result Date: 05/04/2020 ----------------------------------------------------------------------  OBSTETRICS REPORT                       (Signed Final 05/04/2020 03:08 pm) ---------------------------------------------------------------------- Patient Info  ID #:       [id number]                          D.O.B.:  14-Jan-1986 (34 yrs)  Name:       Joanne Merritt                 Visit Date: 05/04/2020 01:18 pm ---------------------------------------------------------------------- Performed By  Attending:  Ma Rings MD         Ref. Address:     [address]                                                             [address]  Performed By:     Percell Boston          [facility]  Referred By:      Yehuda Mao MD ---------------------------------------------------------------------- Orders  #  Description                           Code        Ordered By  1  [place] MFM UA CORD DOPPLER                76820.02    RAVI SHANKAR  2  [place] MFM UA DOPPLER ADDL                76820.03    RAVI SHANKAR     GEST RE EVAL ----------------------------------------------------------------------  #  Order #                     Accession #                Episode #  1  [id number]                   [id number]                 [id number]  2  [id number]                   [id number]                 [id number] ---------------------------------------------------------------------- Indications  Maternal care for known or suspected poor      O36.5932  fetal growth, third trimester, fetus 2 IUGR  Twin pregnancy, di/di, third trimester         O30.043  [redacted] weeks gestation of pregnancy                Z3A.30  Encounter for other antenatal screening        Z36.2  follow-up  Advanced maternal age multigravida 68+,        O10.523  third trimester  Polyhydramnios, third trimester, antepartum    O40.3XX1   condition or complication, fetus 1  Obesity complicating pregnancy, third          O99.213  trimester  Previous cesarean delivery, antepartum x 1     O34.219  Declined genetic testing ---------------------------------------------------------------------- Fetal Evaluation (Fetus A)  Num Of Fetuses:         2  Fetal Heart Rate(bpm):  141  Cardiac Activity:       Observed  Fetal Lie:              Maternal left side  Presentation:           Cephalic  Placenta:               Anterior  P. Cord Insertion:      Previously Visualized  Membrane Desc:      Dividing Membrane seen - Dichorionic.  Amniotic Fluid  AFI FV:      Within normal limits                              Largest Pocket(cm)                              3 ---------------------------------------------------------------------- OB History  Gravidity:    5         Term:   1        Prem:   0        SAB:   2  TOP:          0       Ectopic:  1        Living: 1 ---------------------------------------------------------------------- Gestational Age (Fetus A)  LMP:           30w 3d        Date:  10/04/19                 EDD:   07/10/20  Best:          Georgiann Hahn 3d     Det. By:  LMP  (10/04/19)          EDD:   07/10/20 ---------------------------------------------------------------------- Anatomy (Fetus A)  Thoracic:              Appears normal         Bladder:                Appears normal  Stomach:               Appears normal, left                         sided ---------------------------------------------------------------------- Doppler - Fetal Vessels (Fetus A)  Umbilical Artery   S/D     %tile      RI               PI                     ADFV    RDFV   3.37       79     0.7             1.13                        No      No ---------------------------------------------------------------------- Fetal Evaluation (Fetus B)  Num Of Fetuses:         2  Fetal Heart Rate(bpm):  155  Cardiac Activity:       Observed  Fetal Lie:              Maternal right side  Presentation:            Breech  Placenta:  Anterior Fundal  P. Cord Insertion:      Previously Visualized  Membrane Desc:      Dividing Membrane seen - Dichorionic.  Amniotic Fluid  AFI FV:      Within normal limits                              Largest Pocket(cm)                              3.4 ---------------------------------------------------------------------- Gestational Age (Fetus B)  LMP:           30w 3d        Date:  10/04/19                 EDD:   07/10/20  Best:          Georgiann Hahn 3d     Det. By:  LMP  (10/04/19)          EDD:   07/10/20 ---------------------------------------------------------------------- Anatomy (Fetus B)  Thoracic:              Appears normal         Bladder:                Appears normal  Stomach:               Appears normal, left                         sided ---------------------------------------------------------------------- Doppler - Fetal Vessels (Fetus B)  Umbilical Artery   S/D     %tile      RI               PI                     ADFV    RDFV   3.16       70    0.68             1.13                        No      No ---------------------------------------------------------------------- Comments  This patient was seen due to IUGR of twin B in a dichorionic,  diamniotic twin gestation.  She denies any problems since  her last exam and reports feeling vigorous fetal movements  x2.  Doppler studies of the umbilical arteries performed for both  twin A and twin B continue to show a normal S/D ratio in both  fetuses.  There were no signs of absent or reversed end-  diastolic flow noted in either fetus today.  The amniotic fluid level appeared within normal limits for both  fetuses.  The patient also had a reactive nonstress test for both twin A  and twin B today.  Due to fetal growth restriction of twin B, we will continue to  follow her with weekly fetal testing.  Another Doppler study and growth scan was scheduled in 1  week.  ----------------------------------------------------------------------                   Ma Rings, MD Electronically Signed Final Report   05/04/2020 03:08 pm ----------------------------------------------------------------------  [place] MFM UA DOPPLER ADDL GEST RE EVAL  Result Date: 04/27/2020 ----------------------------------------------------------------------  OBSTETRICS REPORT                       (  Signed Final 04/27/2020 03:15 pm) ---------------------------------------------------------------------- Patient Info  ID #:       [id number]                          D.O.B.:  1986-09-03 (34 yrs)  Name:       Joanne Merritt                 Visit Date: 04/27/2020 01:15 pm ---------------------------------------------------------------------- Performed By  Attending:        Noralee Space MD        Ref. Address:     [address]                                                             [address]  Performed By:     Lenise Arena        Location[facility]  Referred By:      Yehuda Mao MD ---------------------------------------------------------------------- Orders  #  Description                           Code        Ordered By  1  [place] MFM UA CORD DOPPLER                76820.02    RAVI SHANKAR  2  [place] MFM UA DOPPLER ADDL                76820.03    RAVI SHANKAR     GEST RE EVAL ----------------------------------------------------------------------  #  Order #                     Accession #                Episode #  1  [id number]                   [id number]                 [id number]  2  [id number]                   [id number]                 [id number] ---------------------------------------------------------------------- Indications  Maternal care for known or suspected poor      O36.5932  fetal growth, third trimester, fetus 2 IUGR  Encounter for antenatal screening for          Z36.3  malformations   Twin pregnancy, di/di, third trimester         O30.043  [redacted] weeks gestation of pregnancy  Z3A.29  Advanced maternal age multigravida 65+,        O109.523  third trimester  Polyhydramnios, third trimester, antepartum    O40.3XX1  condition or complication, fetus 1  Obesity complicating pregnancy, third          O99.213  trimester  Previous cesarean delivery, antepartum x 1     O34.219  Declined genetic testing ---------------------------------------------------------------------- Fetal Evaluation (Fetus A)  Num Of Fetuses:         2  Fetal Heart Rate(bpm):  153  Cardiac Activity:       Observed  Fetal Lie:              Maternal left side  Presentation:           Cephalic  Placenta:               Anterior  Amniotic Fluid  AFI FV:      Within normal limits                              Largest Pocket(cm)                              5.6 ---------------------------------------------------------------------- OB History  Gravidity:    5         Term:   1        Prem:   0        SAB:   2  TOP:          0       Ectopic:  1        Living: 1 ---------------------------------------------------------------------- Gestational Age (Fetus A)  LMP:           29w 3d        Date:  10/04/19                 EDD:   07/10/20  Best:          29w 3d     Det. By:  LMP  (10/04/19)          EDD:   07/10/20 ---------------------------------------------------------------------- Anatomy (Fetus A)  Stomach:               Appears normal, left   Bladder:                Appears normal                         sided ---------------------------------------------------------------------- Doppler - Fetal Vessels (Fetus A)  Umbilical Artery   S/D     %tile   3.45       78 ---------------------------------------------------------------------- Fetal Evaluation (Fetus B)  Num Of Fetuses:         2  Fetal Heart Rate(bpm):  151  Cardiac Activity:       Observed  Fetal Lie:              Maternal right side  Presentation:           Oblique  Placenta:                Anterior  Amniotic Fluid  AFI FV:      Within normal limits  Largest Pocket(cm)                              4.8 ---------------------------------------------------------------------- Gestational Age (Fetus B)  LMP:           29w 3d        Date:  10/04/19                 EDD:   07/10/20  Best:          29w 3d     Det. By:  LMP  (10/04/19)          EDD:   07/10/20 ---------------------------------------------------------------------- Anatomy (Fetus B)  Stomach:               Appears normal, left   Bladder:                Appears normal                         sided ---------------------------------------------------------------------- Doppler - Fetal Vessels (Fetus B)  Umbilical Artery   S/D     %tile   3.99       92 ---------------------------------------------------------------------- Cervix Uterus Adnexa  Cervix  Not visualized (advanced GA >24wks) ---------------------------------------------------------------------- Impression  Dichorionic-diamniotic twin pregnancy with selective fetal  growth restriction of twin B (less than the 1st percentile).  Patient returned for antenatal testing.  Twin A: Maternal left, cephalic presentation, anterior  placenta.  Amniotic fluid is normal and good fetal activity  seen.  Umbilical artery Doppler study showed normal forward  diastolic flow.  NST is reactive.  Twin B: Maternal right, oblique lie, anterior placenta. Amniotic  fluid is normal and good fetal activity seen.  Umbilical artery  Doppler study showed normal forward diastolic flow.  NST is  reactive.  We reassured the patient of the findings. ---------------------------------------------------------------------- Recommendations  -UA Doppler and NST next week. ----------------------------------------------------------------------                  Noralee Space, MD Electronically Signed Final Report   04/27/2020 03:15 pm  ----------------------------------------------------------------------  MAU Course  Procedures  MDM -prolonged monitoring from MFM -EFM Twin A: reactive       -baseline: 135       -variability: moderate       -accels: present, 15x15       -decels: absent       -TOCO: ctx q2-58min, pt not feeling -EFM Twin B: reactive with variable       -baseline: 150/145       -variability: moderate       -accels: present, 15x15       -decels: single variable       -TOCO: ctx q2-10min, pt not feeling -initial CE: long/closed/posterior -WetPrep: WNL -GC/CT collected -1L LR given, ctx spaced out, pt still not feeling, declines repeat cervical exam -Dr. Reina Fuse to MAU, reviewed tracing, states she feels comfortable with patient being discharged home -pt discharged to home in stable condition  Orders Placed This Encounter  Procedures  . Wet prep, genital    Standing Status:   Standing    Number of Occurrences:   1  . Insert peripheral IV    Standing Status:   Standing    Number of Occurrences:   1  . Discharge patient    Order Specific Question:   Discharge disposition    Answer:   01-Home or Self  Care [1]    Order Specific Question:   Discharge patient date    Answer:   05/11/2020   Assessment and Plan   1. NST (non-stress test) reactive   2. [redacted] weeks gestation of pregnancy   3. Dichorionic diamniotic twin pregnancy in third trimester   4. Preterm contractions     Allergies as of 05/11/2020      Reactions   Penicillins       Medication List    TAKE these medications   PRENATAL VITAMIN PO Take by mouth.   progesterone 200 MG Supp Place 200 mg vaginally at bedtime.   VITAMIN D3 PO Take by mouth.       -will call with culture results, if positive -pt to keep appt next week as planned for repeat US -PTL precautions -return MAU precautions -pt discharged to home in stable condition  Joni Reining E Kialee Kham 05/11/2020, 9:26 PM

## 2020-05-11 NOTE — Discharge Instructions (Signed)
Preterm Labor and Birth Information ° °The normal length of a pregnancy is 39-41 weeks. Preterm labor is when labor starts before 37 completed weeks of pregnancy. °What are the risk factors for preterm labor? °Preterm labor is more likely to occur in women who: °· Have certain infections during pregnancy such as a bladder infection, sexually transmitted infection, or infection inside the uterus (chorioamnionitis). °· Have a shorter-than-normal cervix. °· Have gone into preterm labor before. °· Have had surgery on their cervix. °· Are younger than age 17 or older than age 35. °· Are African American. °· Are pregnant with twins or multiple babies (multiple gestation). °· Take street drugs or smoke while pregnant. °· Do not gain enough weight while pregnant. °· Became pregnant shortly after having been pregnant. °What are the symptoms of preterm labor? °Symptoms of preterm labor include: °· Cramps similar to those that can happen during a menstrual period. The cramps may happen with diarrhea. °· Pain in the abdomen or lower back. °· Regular uterine contractions that may feel like tightening of the abdomen. °· A feeling of increased pressure in the pelvis. °· Increased watery or bloody mucus discharge from the vagina. °· Water breaking (ruptured amniotic sac). °Why is it important to recognize signs of preterm labor? °It is important to recognize signs of preterm labor because babies who are born prematurely may not be fully developed. This can put them at an increased risk for: °· Long-term (chronic) heart and lung problems. °· Difficulty immediately after birth with regulating body systems, including blood sugar, body temperature, heart rate, and breathing rate. °· Bleeding in the brain. °· Cerebral palsy. °· Learning difficulties. °· Death. °These risks are highest for babies who are born before 34 weeks of pregnancy. °How is preterm labor treated? °Treatment depends on the length of your pregnancy, your condition,  and the health of your baby. It may involve: °· Having a stitch (suture) placed in your cervix to prevent your cervix from opening too early (cerclage). °· Taking or being given medicines, such as: °? Hormone medicines. These may be given early in pregnancy to help support the pregnancy. °? Medicine to stop contractions. °? Medicines to help mature the baby’s lungs. These may be prescribed if the risk of delivery is high. °? Medicines to prevent your baby from developing cerebral palsy. °If the labor happens before 34 weeks of pregnancy, you may need to stay in the hospital. °What should I do if I think I am in preterm labor? °If you think that you are going into preterm labor, call your health care provider right away. °How can I prevent preterm labor in future pregnancies? °To increase your chance of having a full-term pregnancy: °· Do not use any tobacco products, such as cigarettes, chewing tobacco, and e-cigarettes. If you need help quitting, ask your health care provider. °· Do not use street drugs or medicines that have not been prescribed to you during your pregnancy. °· Talk with your health care provider before taking any herbal supplements, even if you have been taking them regularly. °· Make sure you gain a healthy amount of weight during your pregnancy. °· Watch for infection. If you think that you might have an infection, get it checked right away. °· Make sure to tell your health care provider if you have gone into preterm labor before. °This information is not intended to replace advice given to you by your health care provider. Make sure you discuss any questions you have with your   health care provider. Document Revised: 03/11/2019 Document Reviewed: 04/09/2016 Elsevier Patient Education  2020 [employer] Inc[employer]         Abdominal Pain During Pregnancy  Abdominal pain is common during pregnancy, and has many possible causes. Some causes are more serious than others, and sometimes the cause  is not known. Abdominal pain can be a sign that labor is starting. It can also be caused by normal growth and stretching of muscles and ligaments during pregnancy. Always tell your health care provider if you have any abdominal pain. Follow these instructions at home:  Do not have sex or put anything in your vagina until your pain goes away completely.  Get plenty of rest until your pain improves.  Drink enough fluid to keep your urine pale yellow.  Take over-the-counter and prescription medicines only as told by your health care provider.  Keep all follow-up visits as told by your health care provider. This is important. Contact a health care provider if:  Your pain continues or gets worse after resting.  You have lower abdominal pain that: ? Comes and goes at regular intervals. ? Spreads to your back. ? Is similar to menstrual cramps.  You have pain or burning when you urinate. Get help right away if:  You have a fever or chills.  You have vaginal bleeding.  You are leaking fluid from your vagina.  You are passing tissue from your vagina.  You have vomiting or diarrhea that lasts for more than 24 hours.  Your baby is moving less than usual.  You feel very weak or faint.  You have shortness of breath.  You develop severe pain in your upper abdomen. Summary  Abdominal pain is common during pregnancy, and has many possible causes.  If you experience abdominal pain during pregnancy, tell your health care provider right away.  Follow your health care provider's home care instructions and keep all follow-up visits as directed. This information is not intended to replace advice given to you by your health care provider. Make sure you discuss any questions you have with your health care provider. Document Revised: 03/07/2019 Document Reviewed: 02/19/2017 Elsevier Patient Education  2020 [employer] Inc.        Nonstress Test A nonstress test is a procedure that is  done during pregnancy in order to check the baby's heartbeat. The procedure can help show if the baby (fetus) is healthy. It is commonly done when:  The baby is past his or her due date.  The pregnancy is high risk.  The baby is moving less than normal.  The mother has lost a pregnancy in the past.  The health care provider suspects a problem with the baby's growth.  There is too much or too little amniotic fluid. The procedure is often done in the third trimester of pregnancy to find out if an early delivery is needed and whether such a delivery is safe. During a nonstress test, the baby's heartbeat is monitored when the baby is resting and when the baby is moving. If the baby is healthy, the heart rate will increase when he or she moves or kicks and will return to normal when he or she rests. Tell a health care provider about:  Any allergies you have.  Any medical conditions you have.  All medicines you are taking, including vitamins, herbs, eye drops, creams, and over-the-counter medicines. What are the risks? There are no risks to you or your baby from a nonstress test. This procedure should  not be painful or uncomfortable. What happens before the procedure?  Eat a meal right before the test or as directed by your health care provider. Food may help encourage the baby to move.  Use the restroom right before the test. What happens during the procedure?  Two monitors will be placed on your abdomen. One will record the baby's heart rate and the other will record the contractions of your uterus.  You may be asked to lie down on your side or to sit upright.  You may be given a button to press when you feel your baby move.  Your health care provider will listen to your baby's heartbeat and recorded it. He or she may also watch your baby's heartbeat on a screen.  If the baby seems to be sleeping, you may be asked to drink some juice or soda, eat a snack, or change positions. The  procedure may vary among health care providers and hospitals. What happens after the procedure?  Your health care provider will discuss the test results with you and make recommendations for the future. Depending on the results, your health care provider may order additional tests or another course of action.  If your health care provider gave you any diet or activity instructions, make sure to follow them.  Keep all follow-up visits as told by your health care provider. This is important. Summary  A nonstress test is a procedure that is done during pregnancy in order to check the baby's heartbeat. The procedure can help show if the baby is healthy.  The procedure is often done in the third trimester of pregnancy to find out if an early delivery is needed and whether such a delivery is safe.  During a nonstress test, the baby's heartbeat is monitored when the baby is resting and when the baby is moving. If the baby is healthy, the heart rate will increase when he or she moves or kicks and will return to normal when he or she rests.  Your health care provider will discuss the test results with you and make recommendations for the future. This information is not intended to replace advice given to you by your health care provider. Make sure you discuss any questions you have with your health care provider. Document Revised: 02/26/2017 Document Reviewed: 02/26/2017 Elsevier Patient Education  [place].

## 2020-05-14 ENCOUNTER — Other Ambulatory Visit: Payer: Self-pay | Admitting: *Deleted

## 2020-05-14 DIAGNOSIS — O30049 Twin pregnancy, dichorionic/diamniotic, unspecified trimester: Secondary | ICD-10-CM

## 2020-05-14 LAB — GC/CHLAMYDIA PROBE AMP (~~LOC~~) NOT AT ARMC
Chlamydia: NEGATIVE
Comment: NEGATIVE
Comment: NORMAL
Neisseria Gonorrhea: NEGATIVE

## 2020-05-18 ENCOUNTER — Other Ambulatory Visit: Payer: Self-pay | Admitting: Obstetrics and Gynecology

## 2020-05-18 ENCOUNTER — Other Ambulatory Visit: Payer: Self-pay

## 2020-05-18 ENCOUNTER — Ambulatory Visit: Payer: Commercial Managed Care - PPO | Attending: Obstetrics and Gynecology

## 2020-05-18 ENCOUNTER — Ambulatory Visit: Payer: Commercial Managed Care - PPO | Admitting: *Deleted

## 2020-05-18 VITALS — BP 138/81 | HR 96

## 2020-05-18 DIAGNOSIS — O34219 Maternal care for unspecified type scar from previous cesarean delivery: Secondary | ICD-10-CM

## 2020-05-18 DIAGNOSIS — O30043 Twin pregnancy, dichorionic/diamniotic, third trimester: Secondary | ICD-10-CM

## 2020-05-18 DIAGNOSIS — O30049 Twin pregnancy, dichorionic/diamniotic, unspecified trimester: Secondary | ICD-10-CM

## 2020-05-18 DIAGNOSIS — O09523 Supervision of elderly multigravida, third trimester: Secondary | ICD-10-CM

## 2020-05-18 DIAGNOSIS — O403XX1 Polyhydramnios, third trimester, fetus 1: Secondary | ICD-10-CM

## 2020-05-18 DIAGNOSIS — O99213 Obesity complicating pregnancy, third trimester: Secondary | ICD-10-CM

## 2020-05-18 DIAGNOSIS — E669 Obesity, unspecified: Secondary | ICD-10-CM

## 2020-05-18 DIAGNOSIS — O365932 Maternal care for other known or suspected poor fetal growth, third trimester, fetus 2: Secondary | ICD-10-CM | POA: Diagnosis present

## 2020-05-18 DIAGNOSIS — Z362 Encounter for other antenatal screening follow-up: Secondary | ICD-10-CM

## 2020-05-18 DIAGNOSIS — Z3A32 32 weeks gestation of pregnancy: Secondary | ICD-10-CM

## 2020-05-18 NOTE — Procedures (Signed)
Joanne Merritt May 13, 1986 [redacted]w[redacted]d  Fetus A Non-Stress Test Interpretation for 05/18/20  Indication: Di/Di twins  Fetal Heart Rate A Mode: External Baseline Rate (A): 145 bpm Variability: Moderate Accelerations: 15 x 15 Decelerations: None Multiple birth?: No  Uterine Activity Mode: Palpation, Toco Contraction Frequency (min): x1 with U/I Contraction Duration (sec): 20-60 Contraction Quality: Mild Resting Tone Palpated: Relaxed Resting Time: Adequate  Interpretation (Fetal Testing) Nonstress Test Interpretation: Reactive Comments: Reviewed FHR tracing with Dr. Octaviano Batty 1986/11/01 [redacted]w[redacted]d   Fetus B Non-Stress Test Interpretation for 05/18/20  Indication: IUGR  Fetal Heart Rate Fetus B Mode: External, Doppler Baseline Rate (B): 150 BPM Variability: Moderate Accelerations: 15 x 15 Decelerations: None  Uterine Activity Mode: Palpation, Toco Contraction Frequency (min): x1 with U/I Contraction Duration (sec): 20-60 Contraction Quality: Mild Resting Tone Palpated: Relaxed Resting Time: Adequate  Interpretation (Baby B - Fetal Testing) Nonstress Test Interpretation (Baby B): Reactive Comments (Baby B): Reviewed FHR tracing with Dr. Parke Poisson

## 2020-05-25 ENCOUNTER — Ambulatory Visit: Payer: Commercial Managed Care - PPO | Admitting: *Deleted

## 2020-05-25 ENCOUNTER — Ambulatory Visit: Payer: Commercial Managed Care - PPO

## 2020-05-25 ENCOUNTER — Ambulatory Visit: Payer: Commercial Managed Care - PPO | Attending: Maternal & Fetal Medicine

## 2020-05-25 ENCOUNTER — Other Ambulatory Visit: Payer: Self-pay

## 2020-05-25 VITALS — BP 135/66 | HR 88

## 2020-05-25 DIAGNOSIS — Z362 Encounter for other antenatal screening follow-up: Secondary | ICD-10-CM

## 2020-05-25 DIAGNOSIS — O365932 Maternal care for other known or suspected poor fetal growth, third trimester, fetus 2: Secondary | ICD-10-CM | POA: Diagnosis not present

## 2020-05-25 DIAGNOSIS — E669 Obesity, unspecified: Secondary | ICD-10-CM

## 2020-05-25 DIAGNOSIS — O403XX1 Polyhydramnios, third trimester, fetus 1: Secondary | ICD-10-CM | POA: Diagnosis not present

## 2020-05-25 DIAGNOSIS — O09523 Supervision of elderly multigravida, third trimester: Secondary | ICD-10-CM | POA: Diagnosis not present

## 2020-05-25 DIAGNOSIS — O99213 Obesity complicating pregnancy, third trimester: Secondary | ICD-10-CM

## 2020-05-25 DIAGNOSIS — O30049 Twin pregnancy, dichorionic/diamniotic, unspecified trimester: Secondary | ICD-10-CM | POA: Insufficient documentation

## 2020-05-25 DIAGNOSIS — O30043 Twin pregnancy, dichorionic/diamniotic, third trimester: Secondary | ICD-10-CM

## 2020-05-25 DIAGNOSIS — Z3A33 33 weeks gestation of pregnancy: Secondary | ICD-10-CM

## 2020-05-25 NOTE — Procedures (Signed)
Joanne Merritt [birth year] [redacted]w[redacted]d  Fetus A Non-Stress Test Interpretation for 05/25/20  Indication: IUGR  Fetal Heart Rate A Mode: External Baseline Rate (A): 125 bpm Variability: Moderate Accelerations: 15 x 15 Decelerations: None Multiple birth?: Yes  Uterine Activity Mode: Toco Contraction Frequency (min): one UC noted with mild UI Contraction Duration (sec): 20-80 Contraction Quality: Mild Resting Tone Palpated: Relaxed Resting Time: Adequate  Interpretation (Fetal Testing) Nonstress Test Interpretation: Reactive Comments: FHR tracing rev'd by Dr. Parke Poisson

## 2020-05-31 ENCOUNTER — Encounter (HOSPITAL_COMMUNITY): Payer: Self-pay

## 2020-05-31 NOTE — Patient Instructions (Signed)
Jesenia Spera  05/31/2020   Your procedure is scheduled on:  06/13/2020  Arrive at 0630 at [facility] on [address] at [facility]  and [address]. You are invited to use the FREE valet parking or use the Visitor's parking deck.  Pick up the phone at the desk and dial [phone].  Call this number if you have problems the morning of surgery: [phone]  Remember:   Do not eat food:(After Midnight) Desps de medianoche.  Do not drink clear liquids: (After Midnight) Desps de medianoche.  Take these medicines the morning of surgery with A SIP OF WATER:  none   Do not wear jewelry, make-up or nail polish.  Do not wear lotions, powders, or perfumes. Do not wear deodorant.  Do not shave 48 hours prior to surgery.  Do not bring valuables to the hospital.  [facility] is not   responsible for any belongings or valuables brought to the hospital.  Contacts, dentures or bridgework may not be worn into surgery.  Leave suitcase in the car. After surgery it may be brought to your room.  For patients admitted to the hospital, checkout time is 11:00 AM the day of              discharge.      Please read over the following fact sheets that you were given:     Preparing for Surgery

## 2020-06-01 ENCOUNTER — Ambulatory Visit: Payer: Commercial Managed Care - PPO | Attending: Maternal & Fetal Medicine

## 2020-06-01 ENCOUNTER — Other Ambulatory Visit: Payer: Self-pay

## 2020-06-01 ENCOUNTER — Telehealth (HOSPITAL_COMMUNITY): Payer: Self-pay | Admitting: *Deleted

## 2020-06-01 ENCOUNTER — Ambulatory Visit: Payer: Commercial Managed Care - PPO

## 2020-06-01 ENCOUNTER — Ambulatory Visit: Payer: Commercial Managed Care - PPO | Admitting: *Deleted

## 2020-06-01 VITALS — BP 135/89 | HR 113

## 2020-06-01 DIAGNOSIS — Z362 Encounter for other antenatal screening follow-up: Secondary | ICD-10-CM

## 2020-06-01 DIAGNOSIS — O34219 Maternal care for unspecified type scar from previous cesarean delivery: Secondary | ICD-10-CM | POA: Diagnosis not present

## 2020-06-01 DIAGNOSIS — O09523 Supervision of elderly multigravida, third trimester: Secondary | ICD-10-CM

## 2020-06-01 DIAGNOSIS — E669 Obesity, unspecified: Secondary | ICD-10-CM

## 2020-06-01 DIAGNOSIS — O0990 Supervision of high risk pregnancy, unspecified, unspecified trimester: Secondary | ICD-10-CM | POA: Insufficient documentation

## 2020-06-01 DIAGNOSIS — O30049 Twin pregnancy, dichorionic/diamniotic, unspecified trimester: Secondary | ICD-10-CM

## 2020-06-01 DIAGNOSIS — O365932 Maternal care for other known or suspected poor fetal growth, third trimester, fetus 2: Secondary | ICD-10-CM | POA: Diagnosis not present

## 2020-06-01 DIAGNOSIS — O403XX1 Polyhydramnios, third trimester, fetus 1: Secondary | ICD-10-CM

## 2020-06-01 DIAGNOSIS — O30043 Twin pregnancy, dichorionic/diamniotic, third trimester: Secondary | ICD-10-CM | POA: Diagnosis not present

## 2020-06-01 DIAGNOSIS — Z3A34 34 weeks gestation of pregnancy: Secondary | ICD-10-CM

## 2020-06-01 DIAGNOSIS — O99213 Obesity complicating pregnancy, third trimester: Secondary | ICD-10-CM

## 2020-06-01 NOTE — Telephone Encounter (Signed)
Preadmission screen  

## 2020-06-01 NOTE — Procedures (Signed)
Joanne Merritt 23-Mar-1986 [redacted]w[redacted]d  Fetus A Non-Stress Test Interpretation for 06/01/20  Indication: IUGR Joanne Merritt 1986/05/11 [redacted]w[redacted]d   Fetus B Non-Stress Test Interpretation for 06/01/20  Indication: IUGR  Fetal Heart Rate Fetus B Mode: External Baseline Rate (B): 150 BPM Variability: Moderate Accelerations: 15 x 15 Decelerations: None  Uterine Activity Mode: Toco Contraction Frequency (min): none noted Resting Tone Palpated: Relaxed Resting Time: Adequate  Interpretation (Baby B - Fetal Testing) Nonstress Test Interpretation (Baby B): Reactive Comments (Baby B): FHR tracing rev'd by Dr. Parke Poisson    Fetal Heart Rate A Mode: External Baseline Rate (A): 140 bpm Variability: Moderate Accelerations: 15 x 15 Decelerations: None Multiple birth?: Yes  Uterine Activity Mode: Toco Contraction Frequency (min): none noted Resting Tone Palpated: Relaxed Resting Time: Adequate  Interpretation (Fetal Testing) Nonstress Test Interpretation: Reactive Comments: FHR tracing rev'd by Dr. Parke Poisson

## 2020-06-04 ENCOUNTER — Telehealth (HOSPITAL_COMMUNITY): Payer: Self-pay | Admitting: *Deleted

## 2020-06-04 ENCOUNTER — Encounter (HOSPITAL_COMMUNITY): Payer: Self-pay

## 2020-06-04 NOTE — Telephone Encounter (Signed)
Preadmission screen  

## 2020-06-05 ENCOUNTER — Other Ambulatory Visit: Payer: Self-pay

## 2020-06-05 ENCOUNTER — Ambulatory Visit (INDEPENDENT_AMBULATORY_CARE_PROVIDER_SITE_OTHER): Payer: Commercial Managed Care - PPO

## 2020-06-05 VITALS — BP 134/78 | HR 89

## 2020-06-05 DIAGNOSIS — O365931 Maternal care for other known or suspected poor fetal growth, third trimester, fetus 1: Secondary | ICD-10-CM

## 2020-06-05 MED ORDER — BETAMETHASONE SOD PHOS & ACET 6 (3-3) MG/ML IJ SUSP
12.0000 mg | INTRAMUSCULAR | Status: AC
  Administered 2020-06-05 – 2020-06-06 (×2): 12 mg via INTRAMUSCULAR

## 2020-06-05 NOTE — Progress Notes (Signed)
Cynda Familia here for Betamethasone  Injection.  Injection administered without complication. Patient will return in 24 hours for next injection.  Ralene Bathe, RN 06/05/2020  4:26 PM

## 2020-06-05 NOTE — Progress Notes (Signed)
Patient seen and assessed by nursing staff.  Agree with documentation and plan.  

## 2020-06-06 ENCOUNTER — Ambulatory Visit (INDEPENDENT_AMBULATORY_CARE_PROVIDER_SITE_OTHER): Payer: Commercial Managed Care - PPO

## 2020-06-06 DIAGNOSIS — Z3A35 35 weeks gestation of pregnancy: Secondary | ICD-10-CM

## 2020-06-06 DIAGNOSIS — O365931 Maternal care for other known or suspected poor fetal growth, third trimester, fetus 1: Secondary | ICD-10-CM

## 2020-06-06 DIAGNOSIS — O365930 Maternal care for other known or suspected poor fetal growth, third trimester, not applicable or unspecified: Secondary | ICD-10-CM

## 2020-06-06 NOTE — Progress Notes (Signed)
Pt here today for second dose Betamethasone.  Administered on RUQ.  Pt tolerated well.  Pt return to [facility] for continued prenatal care.    Addison Naegeli, RN  06/06/20

## 2020-06-07 NOTE — Progress Notes (Signed)
Patient seen and assessed by nursing staff.  Agree with documentation and plan.  

## 2020-06-08 ENCOUNTER — Ambulatory Visit: Payer: Commercial Managed Care - PPO | Admitting: *Deleted

## 2020-06-08 ENCOUNTER — Encounter (HOSPITAL_COMMUNITY): Payer: Self-pay | Admitting: Obstetrics and Gynecology

## 2020-06-08 ENCOUNTER — Inpatient Hospital Stay (HOSPITAL_COMMUNITY)
Admission: AD | Admit: 2020-06-08 | Discharge: 2020-06-13 | DRG: 786 | Disposition: A | Discharge status: Home / Self Care | Payer: Commercial Managed Care - PPO | Attending: Obstetrics and Gynecology | Admitting: Obstetrics and Gynecology

## 2020-06-08 ENCOUNTER — Ambulatory Visit (HOSPITAL_BASED_OUTPATIENT_CLINIC_OR_DEPARTMENT_OTHER): Payer: Commercial Managed Care - PPO

## 2020-06-08 ENCOUNTER — Other Ambulatory Visit: Payer: Self-pay

## 2020-06-08 VITALS — BP 147/79 | HR 85

## 2020-06-08 DIAGNOSIS — Z98891 History of uterine scar from previous surgery: Secondary | ICD-10-CM

## 2020-06-08 DIAGNOSIS — Z880 Allergy status to penicillin: Secondary | ICD-10-CM

## 2020-06-08 DIAGNOSIS — O34219 Maternal care for unspecified type scar from previous cesarean delivery: Secondary | ICD-10-CM

## 2020-06-08 DIAGNOSIS — O365912 Maternal care for other known or suspected poor fetal growth, first trimester, fetus 2: Secondary | ICD-10-CM | POA: Insufficient documentation

## 2020-06-08 DIAGNOSIS — Z3A35 35 weeks gestation of pregnancy: Secondary | ICD-10-CM

## 2020-06-08 DIAGNOSIS — O30049 Twin pregnancy, dichorionic/diamniotic, unspecified trimester: Secondary | ICD-10-CM

## 2020-06-08 DIAGNOSIS — O30043 Twin pregnancy, dichorionic/diamniotic, third trimester: Secondary | ICD-10-CM | POA: Diagnosis present

## 2020-06-08 DIAGNOSIS — O321XX2 Maternal care for breech presentation, fetus 2: Principal | ICD-10-CM | POA: Diagnosis present

## 2020-06-08 DIAGNOSIS — O365932 Maternal care for other known or suspected poor fetal growth, third trimester, fetus 2: Secondary | ICD-10-CM | POA: Diagnosis not present

## 2020-06-08 DIAGNOSIS — Z362 Encounter for other antenatal screening follow-up: Secondary | ICD-10-CM

## 2020-06-08 DIAGNOSIS — L910 Hypertrophic scar: Secondary | ICD-10-CM | POA: Diagnosis present

## 2020-06-08 DIAGNOSIS — O09523 Supervision of elderly multigravida, third trimester: Secondary | ICD-10-CM | POA: Diagnosis not present

## 2020-06-08 DIAGNOSIS — O34211 Maternal care for low transverse scar from previous cesarean delivery: Secondary | ICD-10-CM | POA: Diagnosis present

## 2020-06-08 DIAGNOSIS — O403XX1 Polyhydramnios, third trimester, fetus 1: Secondary | ICD-10-CM

## 2020-06-08 DIAGNOSIS — O9972 Diseases of the skin and subcutaneous tissue complicating childbirth: Secondary | ICD-10-CM | POA: Diagnosis present

## 2020-06-08 DIAGNOSIS — E669 Obesity, unspecified: Secondary | ICD-10-CM

## 2020-06-08 DIAGNOSIS — O99213 Obesity complicating pregnancy, third trimester: Secondary | ICD-10-CM

## 2020-06-08 DIAGNOSIS — Z20822 Contact with and (suspected) exposure to covid-19: Secondary | ICD-10-CM | POA: Diagnosis present

## 2020-06-08 MED ORDER — LACTATED RINGERS IV BOLUS
1000.0000 mL | Freq: Once | INTRAVENOUS | Status: AC
  Administered 2020-06-08: 1000 mL via INTRAVENOUS

## 2020-06-08 MED ORDER — NIFEDIPINE 10 MG PO CAPS
10.0000 mg | ORAL_CAPSULE | ORAL | Status: AC | PRN
  Administered 2020-06-08 – 2020-06-09 (×3): 10 mg via ORAL
  Filled 2020-06-08 (×3): qty 1

## 2020-06-08 NOTE — Procedures (Signed)
Joanne Merritt January 22, 1986 [redacted]w[redacted]d  Fetus A Non-Stress Test Interpretation for 06/08/20  Indication: IUGR Joanne Merritt [birth year] [redacted]w[redacted]d   Fetus B Non-Stress Test Interpretation for 06/08/20  Indication: IUGR  Fetal Heart Rate Fetus B Mode: External Baseline Rate (B): 145 BPM Variability: Moderate Accelerations: 15 x 15 Decelerations: None     Interpretation (Baby B - Fetal Testing) Nonstress Test Interpretation (Baby B): Reactive Comments (Baby B): FHR tracing rev'd by Dr. Judeth Cornfield    Fetal Heart Rate A Mode: External Baseline Rate (A): 135 bpm Variability: Moderate Accelerations: 15 x 15 Decelerations: None Multiple birth?: Yes     Interpretation (Fetal Testing) Nonstress Test Interpretation: Reactive Comments: FHR tracing rev'd by Dr. Judeth Cornfield

## 2020-06-08 NOTE — MAU Note (Signed)
Pt reports to MAU c/o ctx for the last 3 hours. Pt reports the ctxs are currently 6 mins apart currently. No bleeding or LOF. +FMx2.

## 2020-06-08 NOTE — MAU Provider Note (Signed)
History     CSN: [id number]  Arrival date and time: 06/08/20 2044   First Provider Initiated Contact with Patient 06/08/20 2128      Chief Complaint  Patient presents with  . Contractions   Joanne Merritt is a 34 y.o. G5P1 at [redacted]w[redacted]d who presents to MAU with complaints of contractions. Patient reports contractions started occurring this afternoon, reports contractions occurring every 6-10 minutes. Patient reports breathing through contractions, rates pain 6/10. Denies vaginal bleeding or LOF. +FM x2. Plans for repeat C/S on Wednesday.    OB History    Gravida  5   Para  1   Term  1   Preterm      AB  3   Living  1     SAB  2   TAB      Ectopic  1   Multiple      Live Births              Past Medical History:  Diagnosis Date  . AMA (advanced maternal age) multigravida 33+, third trimester   . Asthma    childhood  . Medical history non-contributory     Past Surgical History:  Procedure Laterality Date  . CESAREAN SECTION    . DILATION AND CURETTAGE OF UTERUS      History reviewed. No pertinent family history.  Social History   Tobacco Use  . Smoking status: Never Smoker  . Smokeless tobacco: Never Used  Vaping Use  . Vaping Use: Never used  Substance Use Topics  . Alcohol use: Never  . Drug use: Never    Allergies:  Allergies  Allergen Reactions  . Penicillins Hives  . Tomato Hives    Medications Prior to Admission  Medication Sig Dispense Refill Last Dose  . Cholecalciferol (VITAMIN D) 50 MCG (2000 UT) tablet Take 2,000 Units by mouth daily.   06/08/2020 at Unknown time  . Prenatal 27-1 MG TABS Take 1 tablet by mouth daily.   06/08/2020 at Unknown time    Review of Systems  Constitutional: Negative.   Respiratory: Negative.   Cardiovascular: Negative.   Gastrointestinal: Positive for abdominal pain. Negative for constipation, diarrhea, nausea and vomiting.  Genitourinary: Negative.   Musculoskeletal: Negative.   Neurological:  Negative.   Psychiatric/Behavioral: Negative.    Physical Exam   Blood pressure 132/78, pulse 76, temperature 98.6 F (37 C), temperature source Oral, resp. rate 20, weight 100.4 kg, last menstrual period 10/04/2019.  Physical Exam HENT:     Head: Normocephalic.  Cardiovascular:     Rate and Rhythm: Normal rate and regular rhythm.  Pulmonary:     Effort: Pulmonary effort is normal. No respiratory distress.     Breath sounds: Normal breath sounds. No wheezing.  Abdominal:     Comments: Moderate strength contractions palpated   Skin:    General: Skin is warm and dry.  Neurological:     Mental Status: She is alert and oriented to person, place, and time.  Psychiatric:        Mood and Affect: Mood normal.        Behavior: Behavior normal.        Thought Content: Thought content normal.    Initial cervical examination:  1/100/-2  FHRA- 145/mod/+accels/ no decelerations  FHRB- 150/ mod/+accels/ no decelerations   MAU Course  Procedures  MDM IV LR bolus, plan to recheck cervix in 1 hour   Reassessment @ 2345- patient reports contractions continue to be present, ordered procardia  for maternal relief and second IV LR bolus , cervix 1.5/100/-2   Reassessment @ 0100, patient reports contractions are stronger and closer together.  Dilation: 2 Effacement (%): 100 Station: -1 Presentation: Vertex Exam by:: Lanice Shirts CNM  Patient laboring  Discussed with Dr Mindi Slicker assessment and management. Recommends planning for delivery, prepare for OR.   Assessment and Plan  1. Preterm labor   Prepare for OR, repeat C/S  Orders for admission placed  Care taken over by Dr Mindi Slicker   Sharyon Cable 06/09/2020, 1:51 AM

## 2020-06-08 NOTE — Anesthesia Preprocedure Evaluation (Addendum)
Anesthesia Evaluation  Patient identified by MRN, date of birth, ID band Patient awake    Reviewed: Allergy & Precautions, NPO status , Patient's Chart, lab work & pertinent test results  History of Anesthesia Complications Negative for: history of anesthetic complications  Airway Mallampati: II  TM Distance: >3 FB Neck ROM: Full    Dental no notable dental hx. (+) Dental Advisory Given   Pulmonary asthma ,    Pulmonary exam normal        Cardiovascular negative cardio ROS Normal cardiovascular exam     Neuro/Psych negative neurological ROS  negative psych ROS   GI/Hepatic negative GI ROS, Neg liver ROS,   Endo/Other  negative endocrine ROS  Renal/GU negative Renal ROS  negative genitourinary   Musculoskeletal negative musculoskeletal ROS (+)   Abdominal   Peds  Hematology negative hematology ROS (+)   Anesthesia Other Findings Day of surgery medications reviewed with patient.  Reproductive/Obstetrics (+) Pregnancy (twin gestation, hx of C/S x1)                            Anesthesia Physical Anesthesia Plan  ASA: III  Anesthesia Plan: Spinal   Post-op Pain Management:    Induction:   PONV Risk Score and Plan: 4 or greater and Treatment may vary due to age or medical condition, Ondansetron and Dexamethasone  Airway Management Planned: Natural Airway  Additional Equipment: None  Intra-op Plan:   Post-operative Plan:   Informed Consent: I have reviewed the patients History and Physical, chart, labs and discussed the procedure including the risks, benefits and alternatives for the proposed anesthesia with the patient or authorized representative who has indicated his/her understanding and acceptance.     Dental advisory given  Plan Discussed with: Anesthesiologist, CRNA and Surgeon  Anesthesia Plan Comments:        Anesthesia Quick Evaluation

## 2020-06-09 ENCOUNTER — Inpatient Hospital Stay (HOSPITAL_COMMUNITY): Payer: Commercial Managed Care - PPO | Admitting: Anesthesiology

## 2020-06-09 ENCOUNTER — Encounter (HOSPITAL_COMMUNITY)
Admission: AD | Disposition: A | Discharge status: Home / Self Care | Payer: Self-pay | Source: Home / Self Care | Attending: Obstetrics and Gynecology

## 2020-06-09 ENCOUNTER — Encounter (HOSPITAL_COMMUNITY): Payer: Self-pay | Admitting: Obstetrics and Gynecology

## 2020-06-09 DIAGNOSIS — Z3A35 35 weeks gestation of pregnancy: Secondary | ICD-10-CM | POA: Diagnosis not present

## 2020-06-09 DIAGNOSIS — Z98891 History of uterine scar from previous surgery: Secondary | ICD-10-CM

## 2020-06-09 LAB — CBC
HCT: 34.2 % — ABNORMAL LOW (ref 36.0–46.0)
Hemoglobin: 10.9 g/dL — ABNORMAL LOW (ref 12.0–15.0)
MCH: 25.1 pg — ABNORMAL LOW (ref 26.0–34.0)
MCHC: 31.9 g/dL (ref 30.0–36.0)
MCV: 78.6 fL — ABNORMAL LOW (ref 80.0–100.0)
Platelets: 164 10*3/uL (ref 150–400)
RBC: 4.35 MIL/uL (ref 3.87–5.11)
RDW: 17.4 % — ABNORMAL HIGH (ref 11.5–15.5)
WBC: 11.6 10*3/uL — ABNORMAL HIGH (ref 4.0–10.5)
nRBC: 0.2 % (ref 0.0–0.2)

## 2020-06-09 LAB — CBC WITH DIFFERENTIAL/PLATELET
Abs Immature Granulocytes: 0.09 10*3/uL — ABNORMAL HIGH (ref 0.00–0.07)
Basophils Absolute: 0 10*3/uL (ref 0.0–0.1)
Basophils Relative: 0 %
Eosinophils Absolute: 0 10*3/uL (ref 0.0–0.5)
Eosinophils Relative: 0 %
HCT: 35.2 % — ABNORMAL LOW (ref 36.0–46.0)
Hemoglobin: 11.4 g/dL — ABNORMAL LOW (ref 12.0–15.0)
Immature Granulocytes: 1 %
Lymphocytes Relative: 16 %
Lymphs Abs: 1.6 10*3/uL (ref 0.7–4.0)
MCH: 25.5 pg — ABNORMAL LOW (ref 26.0–34.0)
MCHC: 32.4 g/dL (ref 30.0–36.0)
MCV: 78.7 fL — ABNORMAL LOW (ref 80.0–100.0)
Monocytes Absolute: 1.1 10*3/uL — ABNORMAL HIGH (ref 0.1–1.0)
Monocytes Relative: 11 %
Neutro Abs: 7.3 10*3/uL (ref 1.7–7.7)
Neutrophils Relative %: 72 %
Platelets: 177 10*3/uL (ref 150–400)
RBC: 4.47 MIL/uL (ref 3.87–5.11)
RDW: 17.7 % — ABNORMAL HIGH (ref 11.5–15.5)
WBC: 10.2 10*3/uL (ref 4.0–10.5)
nRBC: 0.4 % — ABNORMAL HIGH (ref 0.0–0.2)

## 2020-06-09 LAB — ABO/RH: ABO/RH(D): B POS

## 2020-06-09 LAB — SARS CORONAVIRUS 2 BY RT PCR (HOSPITAL ORDER, PERFORMED IN ~~LOC~~ HOSPITAL LAB): SARS Coronavirus 2: NEGATIVE

## 2020-06-09 LAB — TYPE AND SCREEN
ABO/RH(D): B POS
Antibody Screen: NEGATIVE

## 2020-06-09 SURGERY — Surgical Case
Anesthesia: Spinal

## 2020-06-09 MED ORDER — TRANEXAMIC ACID-NACL 1000-0.7 MG/100ML-% IV SOLN: 1000.0000 mg | INTRAVENOUS | Status: AC

## 2020-06-09 MED ORDER — ONDANSETRON HCL 4 MG/2ML IJ SOLN: 4.0000 mg | Freq: Three times a day (TID) | INTRAMUSCULAR | Status: DC | PRN

## 2020-06-09 MED ORDER — SENNOSIDES-DOCUSATE SODIUM 8.6-50 MG PO TABS
2.0000 | ORAL_TABLET | ORAL | Status: DC
  Administered 2020-06-10 – 2020-06-12 (×4): 2 via ORAL
  Filled 2020-06-09 (×4): qty 2

## 2020-06-09 MED ORDER — GENTAMICIN SULFATE 40 MG/ML IJ SOLN
5.0000 mg/kg | INTRAVENOUS | Status: AC
  Administered 2020-06-09: 390 mg via INTRAVENOUS
  Filled 2020-06-09: qty 9.75

## 2020-06-09 MED ORDER — PHENYLEPHRINE HCL-NACL 20-0.9 MG/250ML-% IV SOLN
INTRAVENOUS | Status: AC
  Filled 2020-06-09: qty 250

## 2020-06-09 MED ORDER — OXYCODONE HCL 5 MG PO TABS
5.0000 mg | ORAL_TABLET | ORAL | Status: DC | PRN
  Administered 2020-06-10 (×2): 5 mg via ORAL
  Administered 2020-06-11 – 2020-06-12 (×2): 10 mg via ORAL
  Filled 2020-06-09 (×2): qty 2
  Filled 2020-06-09 (×2): qty 1

## 2020-06-09 MED ORDER — TRIAMCINOLONE ACETONIDE 40 MG/ML IJ SUSP: 40.0000 mg | Freq: Once | INTRAMUSCULAR | Status: DC

## 2020-06-09 MED ORDER — ONDANSETRON HCL 4 MG/2ML IJ SOLN
INTRAMUSCULAR | Status: DC | PRN
  Administered 2020-06-09: 4 mg via INTRAVENOUS

## 2020-06-09 MED ORDER — SODIUM CHLORIDE 0.9% FLUSH
INTRAVENOUS | Status: DC | PRN
  Administered 2020-06-09: 5 mL via INTRADERMAL

## 2020-06-09 MED ORDER — DIBUCAINE (PERIANAL) 1 % EX OINT: 1.0000 "application " | TOPICAL_OINTMENT | CUTANEOUS | Status: DC | PRN

## 2020-06-09 MED ORDER — NALBUPHINE HCL 10 MG/ML IJ SOLN: 5.0000 mg | Freq: Once | INTRAMUSCULAR | Status: DC | PRN

## 2020-06-09 MED ORDER — MEPERIDINE HCL 25 MG/ML IJ SOLN: 6.2500 mg | INTRAMUSCULAR | Status: DC | PRN

## 2020-06-09 MED ORDER — COCONUT OIL OIL
1.0000 "application " | TOPICAL_OIL | Status: DC | PRN
  Administered 2020-06-12: 1 via TOPICAL

## 2020-06-09 MED ORDER — TETANUS-DIPHTH-ACELL PERTUSSIS 5-2.5-18.5 LF-MCG/0.5 IM SUSP: 0.5000 mL | Freq: Once | INTRAMUSCULAR | Status: DC

## 2020-06-09 MED ORDER — LACTATED RINGERS IV BOLUS
1000.0000 mL | Freq: Once | INTRAVENOUS | Status: AC
  Administered 2020-06-09: 1000 mL via INTRAVENOUS

## 2020-06-09 MED ORDER — TRANEXAMIC ACID-NACL 1000-0.7 MG/100ML-% IV SOLN
INTRAVENOUS | Status: AC
  Administered 2020-06-09: 1000 mg
  Filled 2020-06-09: qty 100

## 2020-06-09 MED ORDER — TRANEXAMIC ACID-NACL 1000-0.7 MG/100ML-% IV SOLN
INTRAVENOUS | Status: AC
  Filled 2020-06-09: qty 100

## 2020-06-09 MED ORDER — DIPHENHYDRAMINE HCL 25 MG PO CAPS: 25.0000 mg | ORAL_CAPSULE | ORAL | Status: DC | PRN

## 2020-06-09 MED ORDER — IBUPROFEN 800 MG PO TABS
800.0000 mg | ORAL_TABLET | Freq: Three times a day (TID) | ORAL | Status: AC
  Administered 2020-06-09 – 2020-06-11 (×8): 800 mg via ORAL
  Filled 2020-06-09 (×8): qty 1

## 2020-06-09 MED ORDER — SODIUM CHLORIDE 0.9 % IR SOLN
Status: DC | PRN
  Administered 2020-06-09: 1

## 2020-06-09 MED ORDER — MORPHINE SULFATE (PF) 0.5 MG/ML IJ SOLN
INTRAMUSCULAR | Status: AC
  Filled 2020-06-09: qty 10

## 2020-06-09 MED ORDER — PROMETHAZINE HCL 25 MG/ML IJ SOLN: 6.2500 mg | INTRAMUSCULAR | Status: DC | PRN

## 2020-06-09 MED ORDER — BUPIVACAINE IN DEXTROSE 0.75-8.25 % IT SOLN
INTRATHECAL | Status: DC | PRN
  Administered 2020-06-09: 1.4 mL via INTRATHECAL

## 2020-06-09 MED ORDER — TRIAMCINOLONE ACETONIDE 40 MG/ML IJ SUSP
INTRAMUSCULAR | Status: DC | PRN
  Administered 2020-06-09: 40 mg via INTRADERMAL

## 2020-06-09 MED ORDER — ONDANSETRON HCL 4 MG/2ML IJ SOLN
INTRAMUSCULAR | Status: AC
  Filled 2020-06-09: qty 2

## 2020-06-09 MED ORDER — DEXAMETHASONE SODIUM PHOSPHATE 4 MG/ML IJ SOLN
INTRAMUSCULAR | Status: AC
  Filled 2020-06-09: qty 1

## 2020-06-09 MED ORDER — SIMETHICONE 80 MG PO CHEW
80.0000 mg | CHEWABLE_TABLET | ORAL | Status: DC
  Administered 2020-06-10 – 2020-06-12 (×4): 80 mg via ORAL
  Filled 2020-06-09 (×4): qty 1

## 2020-06-09 MED ORDER — LACTATED RINGERS IV SOLN: INTRAVENOUS | Status: DC | PRN

## 2020-06-09 MED ORDER — FENTANYL CITRATE (PF) 100 MCG/2ML IJ SOLN
25.0000 ug | INTRAMUSCULAR | Status: DC | PRN
  Administered 2020-06-09: 25 ug via INTRAVENOUS
  Administered 2020-06-09: 50 ug via INTRAVENOUS

## 2020-06-09 MED ORDER — DEXAMETHASONE SODIUM PHOSPHATE 4 MG/ML IJ SOLN
INTRAMUSCULAR | Status: DC | PRN
Start: 2020-06-09 — End: 2020-06-09
  Administered 2020-06-09: 4 mg via INTRAVENOUS

## 2020-06-09 MED ORDER — FENTANYL CITRATE (PF) 100 MCG/2ML IJ SOLN
INTRAMUSCULAR | Status: AC
  Filled 2020-06-09: qty 2

## 2020-06-09 MED ORDER — SODIUM CHLORIDE (PF) 0.9 % IJ SOLN
INTRAMUSCULAR | Status: AC
  Filled 2020-06-09: qty 10

## 2020-06-09 MED ORDER — WITCH HAZEL-GLYCERIN EX PADS: 1.0000 "application " | MEDICATED_PAD | CUTANEOUS | Status: DC | PRN

## 2020-06-09 MED ORDER — SOD CITRATE-CITRIC ACID 500-334 MG/5ML PO SOLN
30.0000 mL | ORAL | Status: AC
  Administered 2020-06-09: 30 mL via ORAL
  Filled 2020-06-09: qty 30

## 2020-06-09 MED ORDER — SIMETHICONE 80 MG PO CHEW: 80.0000 mg | CHEWABLE_TABLET | ORAL | Status: DC | PRN

## 2020-06-09 MED ORDER — OXYTOCIN-SODIUM CHLORIDE 30-0.9 UT/500ML-% IV SOLN
INTRAVENOUS | Status: AC
  Filled 2020-06-09: qty 500

## 2020-06-09 MED ORDER — PHENYLEPHRINE HCL-NACL 20-0.9 MG/250ML-% IV SOLN
INTRAVENOUS | Status: DC | PRN
  Administered 2020-06-09: 60 ug/min via INTRAVENOUS

## 2020-06-09 MED ORDER — FENTANYL CITRATE (PF) 100 MCG/2ML IJ SOLN
INTRAMUSCULAR | Status: DC | PRN
  Administered 2020-06-09: 15 ug via INTRATHECAL

## 2020-06-09 MED ORDER — NALBUPHINE HCL 10 MG/ML IJ SOLN: 5.0000 mg | INTRAMUSCULAR | Status: DC | PRN

## 2020-06-09 MED ORDER — TRANEXAMIC ACID-NACL 1000-0.7 MG/100ML-% IV SOLN
1000.0000 mg | Freq: Once | INTRAVENOUS | Status: AC
  Administered 2020-06-09: 1000 mg via INTRAVENOUS

## 2020-06-09 MED ORDER — LACTATED RINGERS IV SOLN: INTRAVENOUS | Status: DC

## 2020-06-09 MED ORDER — MORPHINE SULFATE (PF) 0.5 MG/ML IJ SOLN
INTRAMUSCULAR | Status: DC | PRN
  Administered 2020-06-09: 150 ug via INTRATHECAL

## 2020-06-09 MED ORDER — SIMETHICONE 80 MG PO CHEW
80.0000 mg | CHEWABLE_TABLET | Freq: Three times a day (TID) | ORAL | Status: DC
  Administered 2020-06-09 – 2020-06-13 (×9): 80 mg via ORAL
  Filled 2020-06-09 (×10): qty 1

## 2020-06-09 MED ORDER — SODIUM CHLORIDE 0.9% FLUSH: 3.0000 mL | INTRAVENOUS | Status: DC | PRN

## 2020-06-09 MED ORDER — OXYTOCIN-SODIUM CHLORIDE 30-0.9 UT/500ML-% IV SOLN: 2.5000 [IU]/h | INTRAVENOUS | Status: AC

## 2020-06-09 MED ORDER — NALOXONE HCL 0.4 MG/ML IJ SOLN: 0.4000 mg | INTRAMUSCULAR | Status: DC | PRN

## 2020-06-09 MED ORDER — SCOPOLAMINE 1 MG/3DAYS TD PT72
1.0000 | MEDICATED_PATCH | Freq: Once | TRANSDERMAL | Status: DC
  Filled 2020-06-09: qty 1

## 2020-06-09 MED ORDER — OXYTOCIN-SODIUM CHLORIDE 30-0.9 UT/500ML-% IV SOLN
INTRAVENOUS | Status: DC | PRN
Start: 2020-06-09 — End: 2020-06-09
  Administered 2020-06-09: 300 mL via INTRAVENOUS

## 2020-06-09 MED ORDER — DIPHENHYDRAMINE HCL 25 MG PO CAPS: 25.0000 mg | ORAL_CAPSULE | Freq: Four times a day (QID) | ORAL | Status: DC | PRN

## 2020-06-09 MED ORDER — NALOXONE HCL 4 MG/10ML IJ SOLN: 1.0000 ug/kg/h | INTRAVENOUS | Status: DC | PRN

## 2020-06-09 MED ORDER — ZOLPIDEM TARTRATE 5 MG PO TABS: 5.0000 mg | ORAL_TABLET | Freq: Every evening | ORAL | Status: DC | PRN

## 2020-06-09 MED ORDER — DIPHENHYDRAMINE HCL 50 MG/ML IJ SOLN: 12.5000 mg | INTRAMUSCULAR | Status: DC | PRN

## 2020-06-09 MED ORDER — CLINDAMYCIN PHOSPHATE 900 MG/50ML IV SOLN
900.0000 mg | INTRAVENOUS | Status: AC
  Administered 2020-06-09: 900 mg via INTRAVENOUS
  Filled 2020-06-09: qty 50

## 2020-06-09 MED ORDER — PRENATAL MULTIVITAMIN CH
1.0000 | ORAL_TABLET | Freq: Every day | ORAL | Status: DC
  Administered 2020-06-09 – 2020-06-13 (×5): 1 via ORAL
  Filled 2020-06-09 (×5): qty 1

## 2020-06-09 MED ORDER — TRIAMCINOLONE ACETONIDE 40 MG/ML IJ SUSP
INTRAMUSCULAR | Status: AC
  Filled 2020-06-09: qty 1

## 2020-06-09 MED ORDER — MENTHOL 3 MG MT LOZG: 1.0000 | LOZENGE | OROMUCOSAL | Status: DC | PRN

## 2020-06-09 SURGICAL SUPPLY — 45 items
BENZOIN TINCTURE PRP APPL 2/3 (GAUZE/BANDAGES/DRESSINGS) ×3 IMPLANT
BINDER ABDOMINAL 12 ML 46-62 (SOFTGOODS) ×3 IMPLANT
CHLORAPREP W/TINT 26ML (MISCELLANEOUS) ×3 IMPLANT
CLAMP CORD UMBIL (MISCELLANEOUS) IMPLANT
CLOSURE STERI-STRIP 1/4X4 (GAUZE/BANDAGES/DRESSINGS) ×3 IMPLANT
CLOSURE WOUND 1/2 X4 (GAUZE/BANDAGES/DRESSINGS)
CLOTH BEACON ORANGE TIMEOUT ST (SAFETY) ×3 IMPLANT
DRAPE C SECTION CLR SCREEN (DRAPES) ×3 IMPLANT
DRSG OPSITE POSTOP 4X10 (GAUZE/BANDAGES/DRESSINGS) ×3 IMPLANT
ELECT REM PT RETURN 9FT ADLT (ELECTROSURGICAL) ×3
ELECTRODE REM PT RTRN 9FT ADLT (ELECTROSURGICAL) ×1 IMPLANT
EXTRACTOR VACUUM KIWI (MISCELLANEOUS) IMPLANT
GAUZE SPONGE 4X4 12PLY STRL LF (GAUZE/BANDAGES/DRESSINGS) ×6 IMPLANT
GLOVE BIO SURGEON STRL SZ 6.5 (GLOVE) ×2 IMPLANT
GLOVE BIO SURGEONS STRL SZ 6.5 (GLOVE) ×1
GLOVE BIOGEL PI IND STRL 7.0 (GLOVE) ×2 IMPLANT
GLOVE BIOGEL PI INDICATOR 7.0 (GLOVE) ×4
GOWN STRL REUS W/TWL LRG LVL3 (GOWN DISPOSABLE) ×6 IMPLANT
KIT ABG SYR 3ML LUER SLIP (SYRINGE) IMPLANT
NEEDLE HYPO 22GX1.5 SAFETY (NEEDLE) ×3 IMPLANT
NEEDLE HYPO 25X5/8 SAFETYGLIDE (NEEDLE) IMPLANT
NS IRRIG 1000ML POUR BTL (IV SOLUTION) ×3 IMPLANT
PACK C SECTION WH (CUSTOM PROCEDURE TRAY) ×3 IMPLANT
PAD ABD 7.5X8 STRL (GAUZE/BANDAGES/DRESSINGS) ×3 IMPLANT
PAD OB MATERNITY 4.3X12.25 (PERSONAL CARE ITEMS) ×3 IMPLANT
RETRACTOR TRAXI PANNICULUS (MISCELLANEOUS) ×1 IMPLANT
RETRACTOR WND ALEXIS 25 LRG (MISCELLANEOUS) ×1 IMPLANT
RTRCTR C-SECT PINK 25CM LRG (MISCELLANEOUS) IMPLANT
RTRCTR WOUND ALEXIS 25CM LRG (MISCELLANEOUS) ×3
STRIP CLOSURE SKIN 1/2X4 (GAUZE/BANDAGES/DRESSINGS) IMPLANT
SUT CHROMIC 1 CTX 36 (SUTURE) ×6 IMPLANT
SUT PLAIN 0 NONE (SUTURE) IMPLANT
SUT PLAIN 2 0 XLH (SUTURE) ×3 IMPLANT
SUT VIC AB 0 CT1 27 (SUTURE) ×4
SUT VIC AB 0 CT1 27XBRD ANBCTR (SUTURE) ×2 IMPLANT
SUT VIC AB 2-0 CT1 27 (SUTURE) ×4
SUT VIC AB 2-0 CT1 TAPERPNT 27 (SUTURE) ×2 IMPLANT
SUT VIC AB 3-0 CT1 27 (SUTURE)
SUT VIC AB 3-0 CT1 TAPERPNT 27 (SUTURE) IMPLANT
SUT VIC AB 4-0 KS 27 (SUTURE) ×6 IMPLANT
SYR CONTROL 10ML LL (SYRINGE) ×3 IMPLANT
TOWEL OR 17X24 6PK STRL BLUE (TOWEL DISPOSABLE) ×3 IMPLANT
TRAXI PANNICULUS RETRACTOR (MISCELLANEOUS) ×2
TRAY FOLEY W/BAG SLVR 14FR LF (SET/KITS/TRAYS/PACK) ×3 IMPLANT
WATER STERILE IRR 1000ML POUR (IV SOLUTION) ×3 IMPLANT

## 2020-06-09 NOTE — MAU Note (Signed)
Message left for anesthesia.

## 2020-06-09 NOTE — Op Note (Addendum)
Operative Note    Preoperative Diagnosis 1. Mercie Eon twins at 51 6/7wks 2. Preterm labor  3. Prior cesarean section 4. Baby B IUGR 5. Keloid incision  Postoperative Diagnosis: Same ( 1-4)                                               5. Revised Keloid incision   Procedure: Low transverse cesarean section with double layered closure and scar revision with kenalog    Surgeon: Britt Bottom DO Assist : Jesus King Scrub and Romeo Rabon L Scrub  Anesthesia: Milon Score MD CRNA : Aleda Grana   Fluids: LR EBL: UOP:   Findings: Viable female infant in vertex position, Apgars 8,9; weight pending                   Viable female infant in transverse back down lie; delivered frank breech, Apgars 7,8; weight pending                   Grossly normal uterus, tubes and ovaries       Specimen: Placenta to pathology  Indications:  Pt presented with complaint of contractions. She was monitored in L/D and confirmed to have persistent contractions with cervical change from 1cm to 2/80--2 despite fluid bolus. Pt had received BMZ earlier in week due to planned c/s on 7/14 due to IUGR of baby B. Recent dopplers on both babies were nl. BPP x 2 10/10 Pt has a history of prior cesarean section. Consent obtained for repeat as originally planned    Procedure Note  Consent verified pre-op. All questions answered   Patient was taken to the operating room where spinal anesthesia was administered. She was prepped and draped in the normal sterile fashion and placed in the dorsal supine position with a leftward tilt after a traxi placed. An appropriate time out was performed. A Pfannenstiel skin incision was then made underneath the keloid scar. The scar was then excised with small bleeding sited cauterized.The underlying layer of fascia was separated using sharp dissection and Bovie cautery. The fascia was nicked in the midline and the incision was extended laterally with Mayo scissors. The superior and  inferior aspects of the incision were grasped kocher clamps and dissected off the underlying rectus muscles.Rectus muscles were separated in the midline  and the peritoneal cavity entered bluntly. The peritoneal incision was then extended both superiorly and inferiorly with careful attention to avoid both bowel and bladder. The Alexis self-retaining wound retractor was then placed and the lower uterine segment exposed. The bladder flap was developed with Metzenbaum scissors and pushed away from the lower uterine segment. The lower uterine segment was then incised in a transverse fashion and the cavity entered. Baby A was encountered immediately with right arm presenting through the incision. The arm was returned and infant delivered with vertex easily.  Vigorous spontaneous cry was noted during delivery. Loose nuchal x 1 was reduced over his head. The remainder of the body delivered next also with no difficulty. His nose and mouth were bulb suctioned. The cord was clamped and cut after a minute delay. The infant was handed off to the waiting NICU team.  Next, the amniotic sac was ruptured for baby B. Infant was noted to be in transverse position with smooth of back down. An attempt was made to rotate baby  to vertex with no success thus baby was effectively rotated to frank breech position and delivered with no issues. Bulb suction of nose and mouth were performed and cord clamped and cut also after a minute delay. Baby B was then handed off to NICU team as well.  Next, cord blood was obtained x 2. The placenta was then manually expressed from the uterus and the uterus cleared of all clots and debris with moist lap sponge. The uterine incision was then repaired in 2 layers the first layer was a running locked layer 1-0 chromic and the second an imbricating layer of the same suture. The tubes and ovaries were inspected- noted to be grossly normal.  The gutters were cleared of all clots and debris. The uterine  incision was inspected again and found to be hemostatic.  Next, the peritoneum was then reapproximated in a purse string fashion with sutures of 2-0 Vicryl. The rectus muscles were reapproximated next in a loose figure 8 stitch using the same suture. The fascia was then closed with 0 Vicryl in a running fashion. Firm areas of scarred subcutaneous tissue were excised and the scarred layer undermined for better cosmesis and comfort for patient. The subcutaneous tissue was reapproximated with 3-0 plain in a running fashion. The skin was closed with a subcuticular stitch of 4-0 Vicryl on a Keith needle. Kenalog mixed with 5cc of normal saline was injection along the incision.The incision was then reinforced with benzoin and Steri-Strips. At the conclusion of the procedure all instruments and sponge counts were correct per nursing x 2.  Patient was taken to the recovery room in good condition with baby A accompanying her skin to skin; Baby B to NICU with Dad.

## 2020-06-09 NOTE — Transfer of Care (Signed)
Immediate Anesthesia Transfer of Care Note  Patient: Joanne Merritt  Procedure(s) Performed: CESAREAN SECTION MULTI-GESTATIONAL (N/A )  Patient Location: PACU  Anesthesia Type:Spinal  Level of Consciousness: awake, alert  and oriented  Airway & Oxygen Therapy: Patient Spontanous Breathing  Post-op Assessment: Report given to RN and Post -op Vital signs reviewed and stable  Post vital signs: Reviewed and stable  Last Vitals:  Vitals Value Taken Time  BP    Temp    Pulse 82 06/09/20 0436  Resp 16 06/09/20 0436  SpO2 98 % 06/09/20 0436  Vitals shown include unvalidated device data.  Last Pain:  Vitals:   06/08/20 2122  TempSrc:   PainSc: 7          Complications: No complications documented.

## 2020-06-09 NOTE — Progress Notes (Signed)
Patient ID: Monette Omara, female   DOB: [birth year], 34 y.o.   MRN: [id number] Called to pts bedside in PACU Pt had recovered well however as was being transferred to mother baby floor, she informed nurses she felt a gush of blood. On exam, fundus was noted to be firm however large gush of blood noted vaginally.   On arrival, I confirmed the findings reported. Fundus at 1-2cm below umbilicus and firm Incision : dressing with bleeding noted; not active On vaginal exam, LUS also noted to be firm; cervix 2-3cm dilated. Small dark trickle of blood noted.   Total EBL  Vitals stable: 131/79, 88 T 98.5  CBC repeat stat : Hg- 10.9 from 11.7 and plts 164k from 179k  Plan: Dose of Txa given IV          Continue pitocin per protocol          Dressing changed and new pressure dressing with abdominal binder placed          Monitor over next few hours; continue txa po if needed          Explained plan of care and interventions to pt and husband

## 2020-06-09 NOTE — Anesthesia Procedure Notes (Signed)
Spinal  Patient location during procedure: OR Start time: 06/09/2020 2:53 AM End time: 06/09/2020 3:04 AM Staffing Performed: anesthesiologist  Anesthesiologist: Heather Roberts, MD Preanesthetic Checklist Completed: patient identified, IV checked, risks and benefits discussed, surgical consent, monitors and equipment checked, pre-op evaluation and timeout performed Spinal Block Patient position: sitting Prep: DuraPrep Patient monitoring: cardiac monitor, continuous pulse ox and blood pressure Approach: midline Location: L2-3 Injection technique: single-shot Needle Needle type: Pencan  Needle gauge: 24 G Needle length: 9 cm Additional Notes Functioning IV was confirmed and monitors were applied. Sterile prep and drape, including hand hygiene and sterile gloves were used. The patient was positioned and the spine was prepped. The skin was anesthetized with lidocaine.  Free flow of clear CSF was obtained prior to injecting local anesthetic into the CSF.  The spinal needle aspirated freely following injection.  The needle was carefully withdrawn.  The patient tolerated the procedure well.

## 2020-06-09 NOTE — Progress Notes (Signed)
Patient ID: Joanne Merritt, female   DOB: 03-09-1986, 34 y.o.   MRN: [id number] POD#0  Pt doing well. She denies lightheadedness, SOB, CP, HA or blurry vision. She has no complaints other than sore belly from fundal rubs. Lochia decreasing.  VSS GEN - NAD ABD - FF; binder in place, dressing c/d/i EXT - scds in place, +1 edema only b/l, no homans  A/P: POD#0 s/p repeat c/s of di di twins : Baby A in room, baby B in NICU         PP bleeding decreasing appropriately after 2 doses of txa pp  Total ebl in OR + PACU = 988; pt has has a further ( 91 then 2ml )  since being on the floor. Will consider oral txa if continues bleeding.           Routine pp care

## 2020-06-09 NOTE — MAU Note (Signed)
Women[facility] and MB charge notified.

## 2020-06-09 NOTE — H&P (Addendum)
Joanne Merritt is a 34 y.o.G23P1031 female presenting with complaint of preterm labor at 66 6/7wks. Pt noted to make cervical change from 1 to 2cm dilation with persistent contractions.  Pt is dated per LMP and confirmed with a 6week [place]. Judi Cong -di twins. Pt is AMA and has a history of prior cesarean section. Both babies have been getting weekly scans per MFM. Baby B with IUGR; 37% discordance noted. Both with nl dopplers and nst of 10/10 yesterday. Sars covid neg and GBS pending. Pt failed 1hr but passed 3hr glucose screen. She declined genetic and chromosomal screening. Hx asthma as a child.  OB History    Gravida  5   Para  1   Term  1   Preterm      AB  3   Living  1     SAB  2   TAB      Ectopic  1   Multiple      Live Births             Past Medical History:  Diagnosis Date  . AMA (advanced maternal age) multigravida 35+, third trimester   . Asthma    childhood  . Medical history non-contributory    Past Surgical History:  Procedure Laterality Date  . CESAREAN SECTION    . DILATION AND CURETTAGE OF UTERUS     Family History: family history is not on file. Social History:  reports that she has never smoked. She has never used smokeless tobacco. She reports that she does not drink alcohol and does not use drugs.     Maternal Diabetes: No Genetic Screening: Declined Maternal Ultrasounds/Referrals: IUGR baby B Fetal Ultrasounds or other Referrals:  Referred to Materal Fetal Medicine  Maternal Substance Abuse:  No Significant Maternal Medications:  None Significant Maternal Lab Results:  Other: GBS pending Other Comments:  None  Review of Systems  Constitutional: Positive for activity change and fatigue.  Eyes: Negative for photophobia and visual disturbance.  Respiratory: Negative for chest tightness and shortness of breath.   Cardiovascular: Positive for leg swelling. Negative for chest pain and palpitations.  Gastrointestinal: Negative for abdominal pain.   Genitourinary: Positive for pelvic pain.  Neurological: Negative for light-headedness and headaches.  Psychiatric/Behavioral: The patient is nervous/anxious.    Maternal Medical History:  Reason for admission: Contractions.   Contractions: Onset was 6-12 hours ago.   Frequency: irregular.   Perceived severity is moderate.    Fetal activity: Perceived fetal activity is normal.    Prenatal complications: IUGR and preterm labor.   Prenatal Complications - Diabetes: none.    Dilation: 2 Effacement (%): 100 Station: -1 Exam by:: V Rogers CNM Blood pressure (!) 141/73, pulse (!) 103, temperature 98.6 F (37 C), temperature source Oral, resp. rate 20, weight 100.4 kg, last menstrual period 10/04/2019. Maternal Exam:  Uterine Assessment: Contraction strength is moderate.  Contraction frequency is regular.   Abdomen: Patient reports generalized tenderness.  Estimated fetal weight is Baby A AGA 5lbs 1oz and Baby B IUGR 3lbs 3oz as of [place] on 06/01/20.    Introitus: Normal vulva. Vulva is negative for condylomata.  Normal vagina.  Vagina is negative for condylomata.  Pelvis: of concern for delivery.   Cervix: Cervix evaluated by digital exam.     Fetal Exam Fetal Monitor Review: Variability: moderate (6-25 bpm).   Pattern: accelerations present and no decelerations.    Fetal State Assessment: Category I - tracings are normal.     Physical  Exam Constitutional:      General: She is not in acute distress.    Appearance: Normal appearance.  Cardiovascular:     Rate and Rhythm: Normal rate.     Pulses: Normal pulses.  Pulmonary:     Effort: Pulmonary effort is normal.  Abdominal:     Tenderness: There is generalized abdominal tenderness.  Genitourinary:    General: Normal vulva.  Musculoskeletal:        General: Swelling present. Normal range of motion.     Cervical back: Normal range of motion.  Skin:    General: Skin is warm and dry.  Neurological:     General: No  focal deficit present.     Mental Status: She is alert and oriented to person, place, and time. Mental status is at baseline.  Psychiatric:        Mood and Affect: Mood normal.        Behavior: Behavior normal.        Thought Content: Thought content normal.        Judgment: Judgment normal.     Prenatal labs: ABO, Rh: --/--/B POS (07/09 2151) Antibody: NEG (07/09 2151) Rubella: Immune (01/25 0000) RPR: Nonreactive (01/25 0000)  HBsAg: Negative (01/25 0000)  HIV: Non-reactive (01/25 0000)  GBS:     Assessment/Plan: 34yo G 5P1031at 35 6/[redacted]wks gestation with preterm labor- latent phase -Admit  - NPO per ERAS - Clindamycin and gentamycin per protocol as PCN allergic - Sars covid neg - GBS pending -SCDs per protocol to OR - Confirm consent and then to OR when ready  Joanne Merritt Joanne Merritt 06/09/2020, 2:37 AM

## 2020-06-09 NOTE — Lactation Note (Signed)
This note was copied from a baby's chart. Lactation Consultation Note  Patient Name: Joanne Merritt [id number] Date: 06/09/2020 Reason for consult: Late-preterm 34-36.6wks;Initial assessment;Infant < 6lbs;Multiple gestation Mom is [phone] s/p C/S with 2 late preterm. 7 hours old. Baby Joanne rooming with Mom. Baby girl IUGR in NICU. Mom stated she received education on DEBP and hand expression. States she has noted colostrum out of the Left Breast. She has pumped once since delivery. Reviewed late preterm guidelines with Mom to establish milk supply. Mom instructed to put baby to breast with feeding cues no more than 10 minutes and supplement feeding with 22kcal Similac post feeding based on age , 24 hours with 5-10 ml of formula. Mom to place the infant skin to skin at each attempt and to pump afterwards both breasts for 15-20 minutes.  Mom to express colostrum and also can offer the baby spoon feeding or finger feeding. Mom is aware she can also pump in the NICU.  Mom aware the infant should not go more than 3 hours without feeding. Last feeding was at 11 am with 1 ml of formula. Mom will increase supplementation based on guidelines to 5-10 ml. Next feeding scheduled at 12 pm.   Mom has an electric pump at home.   Maternal Data Formula Feeding for Exclusion: No Has patient been taught Hand Expression?: Yes  Feeding Feeding Type: Breast Fed Nipple Type: Extra Slow Flow  LATCH Score                   Interventions    Lactation Tools Discussed/Used Pump Review: Setup, frequency, and cleaning (Reviewed) Initiated by:: Laureen Ochs Date initiated:: 06/09/20   Consult Status Consult Status: Follow-up Date: 06/10/20 Follow-up type: In-patient    Joanne Pautsch  Merritt 06/09/2020, 11:47 AM

## 2020-06-10 LAB — CBC
HCT: 30.5 % — ABNORMAL LOW (ref 36.0–46.0)
Hemoglobin: 10 g/dL — ABNORMAL LOW (ref 12.0–15.0)
MCH: 25.9 pg — ABNORMAL LOW (ref 26.0–34.0)
MCHC: 32.8 g/dL (ref 30.0–36.0)
MCV: 79 fL — ABNORMAL LOW (ref 80.0–100.0)
Platelets: 184 10*3/uL (ref 150–400)
RBC: 3.86 MIL/uL — ABNORMAL LOW (ref 3.87–5.11)
RDW: 17.2 % — ABNORMAL HIGH (ref 11.5–15.5)
WBC: 14.6 10*3/uL — ABNORMAL HIGH (ref 4.0–10.5)
nRBC: 0.4 % — ABNORMAL HIGH (ref 0.0–0.2)

## 2020-06-10 LAB — RPR: RPR Ser Ql: NONREACTIVE

## 2020-06-10 MED ORDER — CLINDAMYCIN PHOSPHATE 900 MG/50ML IV SOLN: 900.0000 mg | INTRAVENOUS | Status: DC

## 2020-06-10 MED ORDER — GENTAMICIN SULFATE 40 MG/ML IJ SOLN: 5.0000 mg/kg | INTRAVENOUS | Status: DC

## 2020-06-10 MED ORDER — LACTATED RINGERS IV SOLN: INTRAVENOUS | Status: DC

## 2020-06-10 MED ORDER — POVIDONE-IODINE 10 % EX SWAB: 2.0000 "application " | Freq: Once | CUTANEOUS | Status: DC

## 2020-06-10 MED ORDER — ACETAMINOPHEN 500 MG PO TABS
1000.0000 mg | ORAL_TABLET | ORAL | Status: AC
  Administered 2020-06-10: 1000 mg via ORAL
  Filled 2020-06-10: qty 2

## 2020-06-10 NOTE — Plan of Care (Signed)
  Problem: Pain Managment: Goal: General experience of comfort will improve Note: Patient has been ambulating in the hallway and has been comfortable; however, this afternoon patient has become uncomfortable and asked for stronger pain medication. Provided oxycodone per order; patient would only like to try one to begin with to see how it affects her. Also provided heat packs for her back and abdomen. Earl Gala, Linda Hedges [place]

## 2020-06-10 NOTE — Lactation Note (Signed)
This note was copied from a baby's chart. Lactation Consultation Note  Patient Name: Boy Annalissa Murphey ALPFX'T Date: 06/10/2020 Reason for consult: Follow-up assessment  P3 mother whose infant twin "A" is now 1 hours old.  This is a LPTI at 35+4 weeks with a CGA of 35+5 weeks.  Twin "B" is in the NICU.  This baby is <6 lbs.  Baby is breast feeding and supplementing with Similac 22.  Baby was swaddled and asleep in the bassinet when I arrived.  Mother has been attempting to breast feed every three hours and baby has latched for 3-5 minutes.  Mother has been supplementing with appropriate volumes of formula after every feeding attempt.  Encouraged to increase volumes to 20-30 mls when baby is 48 hours.  Initially, mother was having a little bit of difficulty with reaching the goal feeding but baby is now feeding better.    Mother did not desire any breast feeding assistance, however, she is aware to call her RN/LC for assistance as needed.  She expressed a concern about not having "any milk" yet.  Reassured her that this is not a concern at this age and to continue practicing her hand expression and pumping for 15 minutes every three hours after feedings. She has had some colostrum drops.  Mother has been following this plan and verbalized understanding.    Mother informed me she has visited the NICU twice.  I encouraged pumping in the NICU if she desires.  Father present.   Maternal Data    Feeding    LATCH Score                   Interventions    Lactation Tools Discussed/Used     Consult Status Consult Status: Follow-up Date: 06/11/20 Follow-up type: In-patient    Dora Sims 06/10/2020, 4:27 PM

## 2020-06-10 NOTE — Progress Notes (Signed)
Subjective: Postpartum Day 1: Cesarean Delivery Patient reports tolerating PO, + flatus and no problems voiding.  Feels sore diffusely over abdomen as expected. Lochia now scant. She denies lightheadedness, SOB, CP or HA. She is bonding well with son ( in room) and daughter (in NICU-stable).   Objective: Vital signs in last 24 hours: Temp:  [98.3 F (36.8 C)-98.9 F (37.2 C)] 98.3 F (36.8 C) (07/11 0637) Pulse Rate:  [64-77] 77 (07/11 0637) Resp:  [16-18] 16 (07/11 0637) BP: (121-127)/(75-82) 122/82 (07/11 0637) SpO2:  [98 %-100 %] 98 % (07/11 7510)  Physical Exam:  General: alert, cooperative and no distress Lochia: appropriate Uterine Fundus: firm Incision: no significant drainage DVT Evaluation: No evidence of DVT seen on physical exam.  Recent Labs    06/09/20 0549 06/10/20 0551  HGB 10.9* 10.0*  HCT 34.2* 30.5*    Assessment/Plan: Status post Cesarean section. Doing well postoperatively.  Continue current care. Informed pt and husband of questionable finding just prior to performing circumcision on baby Aiden - baby had two episodes of jerklike movement that was of concern for possible seizure activity. Pediatric team in room assessed him . No other episodes occurred however out of an abundance of caution, and given he is just over 72hrs old and will be in hospital for another 2 days, decided to postpone his circumcision. He will be monitored by nursing and pediatric team  Joanne Merritt 06/10/2020, 1:10 PM

## 2020-06-11 ENCOUNTER — Other Ambulatory Visit (HOSPITAL_COMMUNITY)
Admission: RE | Admit: 2020-06-11 | Discharge: 2020-06-11 | Disposition: A | Discharge status: Home / Self Care | Payer: Commercial Managed Care - PPO | Source: Ambulatory Visit

## 2020-06-11 HISTORY — DX: Unspecified asthma, uncomplicated: J45.909

## 2020-06-11 HISTORY — DX: Supervision of elderly multigravida, third trimester: O09.523

## 2020-06-11 NOTE — Anesthesia Postprocedure Evaluation (Signed)
Anesthesia Post Note  Patient: Joanne Merritt  Procedure(s) Performed: CESAREAN SECTION MULTI-GESTATIONAL (N/A )     Patient location during evaluation: Mother Baby Anesthesia Type: Spinal Level of consciousness: oriented and awake and alert Pain management: pain level controlled Vital Signs Assessment: post-procedure vital signs reviewed and stable Respiratory status: spontaneous breathing and respiratory function stable Cardiovascular status: blood pressure returned to baseline and stable Postop Assessment: no headache, no backache, no apparent nausea or vomiting and able to ambulate Anesthetic complications: no   No complications documented.  Last Vitals:  Vitals:   06/11/20 0600 06/11/20 1419  BP: 129/84 123/71  Pulse: 77 81  Resp: 18 16  Temp: 37 C 36.7 C  SpO2:  98%    Last Pain:  Vitals:   06/11/20 1810  TempSrc:   PainSc: 3    Pain Goal: Patients Stated Pain Goal: 3 (06/11/20 1810)                 Heather Roberts DANIEL

## 2020-06-11 NOTE — Progress Notes (Signed)
Subjective: Postpartum Day 2: Cesarean Delivery Patient reports incisional pain and tolerating PO.  Nl lochia, pain controlled  Objective: Vital signs in last 24 hours: Temp:  [98.6 F (37 C)-98.9 F (37.2 C)] 98.6 F (37 C) (07/12 0600) Pulse Rate:  [67-77] 77 (07/12 0600) Resp:  [18] 18 (07/12 0600) BP: (129-131)/(72-84) 129/84 (07/12 0600) SpO2:  [98 %-99 %] 98 % (07/11 2211)  Physical Exam:  General: alert and no distress Lochia: appropriate Uterine Fundus: firm Incision: healing well DVT Evaluation: No evidence of DVT seen on physical exam.  Recent Labs    06/09/20 0549 06/10/20 0551  HGB 10.9* 10.0*  HCT 34.2* 30.5*    Assessment/Plan: Status post Cesarean section. Doing well postoperatively.  Continue current care.   Will consider circ for baby B when cleared by nursery, baby girl doing well in NICU Both Breast and bottle feeding  Joanne Merritt 06/11/2020, 8:17 AM

## 2020-06-12 LAB — SURGICAL PATHOLOGY

## 2020-06-12 MED ORDER — ACETAMINOPHEN 500 MG PO TABS: 1000.0000 mg | ORAL_TABLET | Freq: Four times a day (QID) | ORAL | Status: DC | PRN

## 2020-06-12 MED ORDER — IBUPROFEN 600 MG PO TABS
600.0000 mg | ORAL_TABLET | Freq: Four times a day (QID) | ORAL | Status: DC | PRN
  Administered 2020-06-12 – 2020-06-13 (×4): 600 mg via ORAL
  Filled 2020-06-12 (×4): qty 1

## 2020-06-12 NOTE — Lactation Note (Signed)
This note was copied from a baby's chart. Lactation Consultation Note  Patient Name: Boy Aryahi Denzler [id number] Date: 06/12/2020 Reason for consult: Follow-up assessment;Late-preterm 34-36.6wks  Infants are 31 hrs old. Consult took place in postpartum room 418 (infant "A" was in the nursery at this time). Infant B is still in the NICU.  Mom's stated that she was engorged & had been using heat on breasts. Mom did not pump yesterday, but says since 1100 today she has been pumping q 30 min. Prior to yesterday, Mom pumped q3 hrs.  Mom's breasts were heavy & seemed mildly engorged; I provided ice packs for Mom & observed Mom pumping using 24 flanges, initially, and then switching to a size 27 flange on the L breast. Flanges were lightly lubricated with coconut oil. Only 6 mL was obtained. I offered to do hand expression with Mom to see if we could express more, but she declined, so as to give her breasts a break.   I suggested that Mom lie on her bed, raise arms above her head & gently massage breasts towards her axilla. I recommended taking Tylenol or IB around the clock to help with swelling. I spoke with Miguel Rota, RN who may call MD to obtain an order for the Tylenol or IB. I suggested that Mom do ice on for 15 minutes, off for 15 minutes, as desired.   Lurline Hare [facility] 06/12/2020, 2:33 PM

## 2020-06-12 NOTE — Clinical Social Work Maternal (Addendum)
CLINICAL SOCIAL WORK MATERNAL/CHILD NOTE  Patient Details  Name: Joanne Merritt MRN: [id number] Date of Birth: 02/26/1986  Date:  06/12/2020  Clinical Social Worker Initiating Note:  Coren Sagan, LCSW Date/Time: Initiated:  06/12/20/1321     Child's Name:  Taylor Landess (GirlB) Aiden Swamy   Biological Parents:  Mother, Father (Father: DeMarcus Borah)   Need for Interpreter:  None   Reason for Referral:  Other (Comment) (NICU Admission)   Address:  [address] [place] [address]    Phone number:  [phone] (home)     Additional phone number:   Household Members/Support Persons (HM/SP):   Household Member/Support Person 1, Household Member/Support Person 2   HM/SP Name Relationship DOB or Age  HM/SP -1 DeMarcus Wach Husband/FOB    HM/SP -2 Bryson Antonacci son 09/22/17  HM/SP -3        HM/SP -4        HM/SP -5        HM/SP -6        HM/SP -7        HM/SP -8          Natural Supports (not living in the home):  Parent, Immediate Family, Other (Comment)   Professional Supports: None   Employment: Full-time   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   Education:  College graduate   Homebound arranged:    Financial Resources:  Private Insurance   Other Resources:      Cultural/Religious Considerations Which May Impact Care:    Strengths:  Ability to meet basic needs , Pediatrician chosen, Home prepared for child    Psychotropic Medications:         Pediatrician:     ([facility])  Pediatrician List:   [place]    [place]    [place]    [place]    [place]    [place]      Pediatrician Fax Number:    Risk Factors/Current Problems:  None   Cognitive State:  Able to Concentrate , Alert , Insightful , Goal Oriented , Linear Thinking    Mood/Affect:  Calm , Happy , Interested , Relaxed , Comfortable    CSW Assessment: CSW met with MOB at bedside to discuss infant's NICU admission,  FOB present. CSW introduced self and explained reason for visit. MOB was welcoming, pleasant and remained engaged during assessment. MOB reported that she resides with husband/FOB and older son. 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 MOB reported that they have all items needed to care for infants including car seats, cribs and a twin basinet. CSW inquired about MOB's support system besides FOB, MOB reported that her mom, FOB's mom, brother and brother's wife are her supports.   FOB left the room. CSW inquired about MOB's mental health history, MOB denied any mental health history. MOB denied any history of postpartum depression. CSW inquired about how MOB was feeling emotionally after giving birth, MOB reported that she was feeling tired but good. MOB presented calm and did not demonstrate any acute mental health signs/symptoms. CSW assessed for safety, MOB denied SI, HI and domestic violence.   CSW provided education regarding the baby blues period vs. perinatal mood disorders, discussed treatment and gave resources for mental health follow up if concerns arise.  CSW recommends self-evaluation during the postpartum time period using the New Mom Checklist from Postpartum Progress and encouraged MOB to contact a medical professional if symptoms are   noted at any time.    CSW provided review of Sudden Infant Death Syndrome (SIDS) precautions.    Pediatrician came in the room and provided medical update to MOB. FOB returned. Pediatrician left the room after speaking with parents.   CSW and parents discussed infant's NICU admission. MOB reported that she and her husband had a conversation with NICU staff yesterday regarding keeping parents more informed about infant's care. MOB reported that husband has been visiting the NICU more often because she has to care for infant's twin and it is harder for her to get around. FOB reported that he is updated when he asks questions and that  they are interested in being updated about infant's care before it is completed or when changes are made when possible. CSW acknowledged and validated parents feelings. CSW agreed to notify NICU staff of parents request. CSW informed parents about the NICU, what to expect and resources/supports available while infant is admitted to the NICU. FOB reported that meal vouchers would be helpful, CSW agreed to leave at bedside. Parents denied any transportation barriers with visiting infant in the NICU. Parents denied any additional needs/concerns regarding the NICU. CSW informed parents that infant qualifies to apply for SSI benefits. CSW explained SSI benefits and application process. CSW provided parents with SSI application information.   CSW placed 6 meal vouchers at infant's bedside.  CSW updated RN that parents want to be updated about infant's care and changes made to infant's treatment plan.   CSW will continue to offer resources/supports while infant is admitted to the NICU.    CSW Plan/Description:  Sudden Infant Death Syndrome (SIDS) Education, Perinatal Mood and Anxiety Disorder (PMADs) Education, [employer] Income (SSI) Information, Other Patient/Family Education    Burnis Medin, LCSW 07-05-20, 1:37 PM

## 2020-06-12 NOTE — Progress Notes (Signed)
Subjective: Postpartum Day 3: Cesarean Delivery Patient reports tolerating PO, + flatus and no problems voiding.  Ambulating and pumping.  Objective: Vital signs in last 24 hours: Temp:  [98.1 F (36.7 C)-98.2 F (36.8 C)] 98.2 F (36.8 C) (07/13 0609) Pulse Rate:  [60-81] 60 (07/13 0609) Resp:  [16-18] 17 (07/13 0609) BP: (123-129)/(71-77) 129/77 (07/13 0609) SpO2:  [96 %-98 %] 96 % (07/13 0609)  Physical Exam:  General: alert and cooperative Lochia: appropriate Uterine Fundus: firm Incision: C/D/I   Recent Labs    06/10/20 0551  HGB 10.0*  HCT 30.5*    Assessment/Plan: Status post Cesarean section. Doing well postoperatively.  Continue current care.  Working on coordinating home schedule with one twin in NICU and one going home.  Plan for d/c tomorrow.  Circumcision done and care reviewed.  Oliver Pila 06/12/2020, 9:53 AM

## 2020-06-13 ENCOUNTER — Inpatient Hospital Stay (HOSPITAL_COMMUNITY)
Admission: RE | Admit: 2020-06-13 | Payer: Commercial Managed Care - PPO | Source: Home / Self Care | Admitting: Obstetrics and Gynecology

## 2020-06-13 MED ORDER — PRENATAL 27-1 MG PO TABS: 1.0000 | ORAL_TABLET | Freq: Every day | ORAL | 3 refills | Status: AC

## 2020-06-13 MED ORDER — IBUPROFEN 600 MG PO TABS: 600.0000 mg | ORAL_TABLET | Freq: Four times a day (QID) | ORAL | 1 refills | Status: DC | PRN

## 2020-06-13 MED ORDER — OXYCODONE HCL 5 MG PO TABS: 5.0000 mg | ORAL_TABLET | Freq: Four times a day (QID) | ORAL | 0 refills | Status: DC | PRN

## 2020-06-13 NOTE — Lactation Note (Signed)
This note was copied from a baby's chart. Lactation Consultation Note  Patient Name: Joanne Merritt Date: 06/13/2020 Reason for consult: Follow-up assessment;Other (Comment) (2nd [place] visit today due to resolving engorgement)  2nd [place] visit due to mom being engorged this am .  LC had recommended for  Mom to call prior to pumping so the [facility] could assess the nipple size  In relation to the baby's mouth and nipple size. Mom never called LC .  Per mom mentioned she had recently pumped off some milk with some relief.  LC offered to assess breast tissue and mom receptive. Breast  Are full and warm and LC recommended when she arrives home to plan on pumping and not to hold off more than 60 mins.  Earlier reviewed engorgement prevention and tx.  Per mom has a DEBP Spectra at home.  Mom also aware to take her pump kit so she will be able to use in NICU when visiting the other twin.     Maternal Data    Feeding Feeding Type:  (per mom recenty fed the baby formula)  LATCH Score                   Interventions Interventions: Breast feeding basics reviewed  Lactation Tools Discussed/Used Tools: Pump (per mom pumped again this afternoon and got some relief) Breast pump type: Double-Electric Breast Pump   Consult Status Consult Status: Complete Date: 06/13/20    Myer Haff 06/13/2020, 3:52 PM

## 2020-06-13 NOTE — Progress Notes (Signed)
Subjective: Postpartum Day 4: Cesarean Delivery Patient reports incisional pain, tolerating PO and no problems voiding.  Nl lochia, pain controlled  Objective: Vital signs in last 24 hours: Temp:  [98.4 F (36.9 C)-98.8 F (37.1 C)] 98.4 F (36.9 C) (07/14 0515) Pulse Rate:  [66-89] 70 (07/14 0515) Resp:  [18] 18 (07/14 0515) BP: (128-134)/(74-82) 134/82 (07/14 0515) SpO2:  [99 %] 99 % (07/14 0515)  Physical Exam:  General: alert and no distress Lochia: appropriate Uterine Fundus: firm Incision: healing well DVT Evaluation: No evidence of DVT seen on physical exam.  No results for input(s): HGB, HCT in the last 72 hours.  Assessment/Plan: Status post Cesarean section. Doing well postoperatively.  Discharge home with standard precautions and return to clinic in 2 and 6 weeks.  D/C with motrin, prenatal vitamin and oxycodone.    Baby boy doing well; baby girl in NICU - stable.    Joanne Merritt 06/13/2020, 8:27 AM

## 2020-06-13 NOTE — Lactation Note (Signed)
This note was copied from a baby's chart. Lactation Consultation Note  Patient Name: Joanne Merritt [id number] Date: 06/13/2020 Reason for consult: Follow-up assessment;Late-preterm 34-36.6wks;Engorgement;Other (Comment) ([name] has been working well with mom with icing alternating with pumping) Baby is 22 days old According to the [facility] report mom woke up to be engorged both breast.  The [name] Joanne Merritt had been keeping the [facility] posted of the tx plan for the engorgement - and  LC had made recommendation after icing , try moist heat and see if that would help the let down.  As LC entered the room Joanne Isley RN and the charge RN Joanne Merritt were helping with pumping  And alternating massage with 40 ml EBM yield and per mom feeling a good amount of relief.  LC increased the flange size to #36 due to the #30 being to tight. Per the #36 was more comfortable.  The Nurse did well with hand expressing off the left breast / slower on the right breast.  Mom plans to take a break , RN plans to get her ice.  LC recommended for mom to call LC to assess the size of the her nipples after the edema from pumping has improved.    Maternal Data Has patient been taught Hand Expression?: Yes  Feeding Feeding Type: Bottle Fed - Breast Milk Nipple Type: Slow - flow  LATCH Score                   Interventions Interventions: Breast feeding basics reviewed  Lactation Tools Discussed/Used Tools: Pump;Flanges Flange Size: 27;30;36 Breast pump type: Double-Electric Breast Pump;Manual   Consult Status Consult Status: Follow-up Date: 06/13/20 Follow-up type: In-patient    Joanne Merritt 06/13/2020, 12:19 PM

## 2020-06-13 NOTE — Discharge Summary (Signed)
Postpartum Discharge Summary       Patient Name: Joanne Merritt DOB: [birth year] MRN: [id number]  Date of admission: 06/08/2020 Delivery date:   Haruko, Mersch [[id number]]  06/09/2020    Dorena, Dorfman [[id number]]  06/09/2020   Delivering provider:    Lyda Kalata Desaray [[id number]]  OMAYO, CECILIA WOREMA    Cleopha, Indelicato Jem [[id number]]  Sherlyn Hay   Date of discharge: 06/13/2020  Admitting diagnosis: Preterm labor in third trimester [O60.03] Labor and delivery, indication for care [O75.9] Intrauterine pregnancy: [redacted]w[redacted]d    Secondary diagnosis:  Active Problems:   Preterm labor in third trimester   Status post repeat low transverse cesarean section   Preterm delivery, delivered   Labor and delivery, indication for care  Additional problems: delivered via LTCS, twin di/di, keloid at incision - revised   Discharge diagnosis: Preterm Pregnancy Delivered                                              Post partum procedures:N/A Augmentation: N/A Complications: twins w discordant growth  Hospital course: Sceduled C/S   34y.o. yo [id number] admitted to the hospital 06/08/2020 for scheduled cesarean section with the following indication:Elective Repeat and twins laboring.Delivery details are as follows:  Membrane Rupture Time/Date:    BoCashay, Manganelli0[[id number]]3:25 AM    BoSherryl Barterseshonna [[id number]]3:29 AM  ,   BoNefertari, Rebman0[[id number]]06/09/2020    BoJaquilla, Woodroof0[[id number]]06/09/2020    Delivery Method:   BoEryn, Marandola0[[id number]]C-Section, Low Transverse    BoTyne, Banta0[[id number]]C-Section, Low Transverse   Details of operation can be found in separate operative note.  Patient had an uncomplicated postpartum course.  She is ambulating, tolerating a regular diet, passing flatus, and urinating well. Patient is discharged home in stable condition on  06/13/20         Newborn Data: Birth date:   BoAneya, Daddona0[[id number]]06/09/2020    BoValetta, Mulroy0[[id number]]06/09/2020   Birth time:   BoJudyann, Casasola0[[id number]]3:27 AM    BoSherryl Barterseshonna [[id number]]3:30 AM   Gender:   BoShyteria, Lewis0[[id number]]Female    BoMaanasa, Aderhold0[[id number]]Female   Living status:   BoYulonda, [place]0[[id number]]Living    BoAizza, Santiagoeshonna [[id number]]Living   Apgars:   BoHarriette, Toveyeshonna [[id number]]8 [address][[id number]]  5,   BoKrissa, [place]0[[id number]][address]  BoRickell, [place]0[[id number]]9   Weight:   BoCaryssa, Elzey0[[id number]]2404 g    BoJosalyn, Dettmanneshonna [[id number]]1490 g      Magnesium Sulfate received: No BMZ received: No Rhophylac:No MMR:No T-DaP:Given prenatally Flu: No Transfusion:No  Physical exam  Vitals:   06/12/20 0609 06/12/20 1422 06/12/20 1950 06/13/20 0515  BP: 129/77 132/76 128/74 134/82  Pulse: 60 89 66 70  Resp: 17 18 18 18   Temp: 98.2 F (36.8 C) 98.4 F (36.9 C) 98.8 F (37.1 C) 98.4 F (36.9 C)  TempSrc: Oral Oral Oral   SpO2: 96%   99%  Weight:       General: alert  and no distress Lochia: appropriate Uterine Fundus: firm Incision: Healing well with no significant drainage DVT Evaluation: No evidence of DVT seen on physical exam. Labs: Lab Results  Component Value Date   WBC 14.6 (H) 06/10/2020   HGB 10.0 (L) 06/10/2020   HCT 30.5 (L) 06/10/2020   MCV 79.0 (L) 06/10/2020   PLT 184 06/10/2020   No flowsheet data found. Edinburgh Score: Edinburgh Postnatal Depression Scale Screening Tool 06/13/2020  I have been able to laugh and see the funny side of things. 0  I have looked forward with enjoyment to things. 0  I have blamed myself unnecessarily when things went wrong. 0  I have been anxious or worried for no good reason. 1  I have felt scared or panicky for no good reason. 0   Things have been getting on top of me. 1  I have been so unhappy that I have had difficulty sleeping. 0  I have felt sad or miserable. 0  I have been so unhappy that I have been crying. 0  The thought of harming myself has occurred to me. 0  Edinburgh Postnatal Depression Scale Total 2      After visit meds:  Allergies as of 06/13/2020      Reactions   Penicillins Hives   Tomato Hives      Medication List    TAKE these medications   ibuprofen 600 MG tablet Commonly known as: ADVIL Take 1 tablet (600 mg total) by mouth every 6 (six) hours as needed for mild pain or moderate pain.   oxyCODONE 5 MG immediate release tablet Commonly known as: Oxy IR/ROXICODONE Take 1-2 tablets (5-10 mg total) by mouth every 6 (six) hours as needed for moderate pain.   Prenatal 27-1 MG Tabs Take 1 tablet by mouth daily.   Vitamin D 50 MCG (2000 UT) tablet Take 2,000 Units by mouth daily.        Discharge home in stable condition Infant Feeding: Breast Infant Disposition:NICU and home w mother Discharge instruction: per After Visit Summary and Postpartum booklet. Activity: Advance as tolerated. Pelvic rest for 6 weeks.  Diet: routine diet Anticipated Birth Control: Unsure Postpartum Appointment:2 and 6 weeks Additional Postpartum F/U: N/A Future Appointments:No future appointments. Follow up Visit:  [place], [place], DO. Schedule an appointment as soon as possible for a visit in 2 week(s).   Specialty: Obstetrics and Gynecology Why: for incision check (Dr Terri Piedra may be out of office, ok to see anyone ellse) and 6 weeks for full postpartum exam Contact information: [place] [address] [phone]                   06/13/2020 Janyth Contes, MD

## 2020-06-15 ENCOUNTER — Encounter (HOSPITAL_COMMUNITY): Payer: Self-pay | Admitting: Obstetrics and Gynecology

## 2020-06-26 ENCOUNTER — Inpatient Hospital Stay (HOSPITAL_COMMUNITY): Admit: 2020-06-26 | Payer: Self-pay

## 2020-07-11 ENCOUNTER — Inpatient Hospital Stay (HOSPITAL_COMMUNITY)
Admission: AD | Admit: 2020-07-11 | Discharge: 2020-07-11 | Disposition: A | Discharge status: Home / Self Care | Payer: BC Managed Care – PPO | Attending: Obstetrics and Gynecology | Admitting: Obstetrics and Gynecology

## 2020-07-11 ENCOUNTER — Encounter (HOSPITAL_COMMUNITY): Payer: Self-pay | Admitting: Obstetrics and Gynecology

## 2020-07-11 ENCOUNTER — Other Ambulatory Visit: Payer: Self-pay

## 2020-07-11 DIAGNOSIS — O165 Unspecified maternal hypertension, complicating the puerperium: Secondary | ICD-10-CM | POA: Insufficient documentation

## 2020-07-11 LAB — COMPREHENSIVE METABOLIC PANEL
ALT: 18 U/L (ref 0–44)
AST: 20 U/L (ref 15–41)
Albumin: 3.5 g/dL (ref 3.5–5.0)
Alkaline Phosphatase: 58 U/L (ref 38–126)
Anion gap: 11 (ref 5–15)
BUN: 10 mg/dL (ref 6–20)
CO2: 23 mmol/L (ref 22–32)
Calcium: 9.4 mg/dL (ref 8.9–10.3)
Chloride: 104 mmol/L (ref 98–111)
Creatinine, Ser: 0.8 mg/dL (ref 0.44–1.00)
GFR calc Af Amer: >60 mL/min (ref >60)
GFR calc non Af Amer: >60 mL/min (ref >60)
Glucose, Bld: 125 mg/dL — ABNORMAL HIGH (ref 70–99)
Potassium: 3.4 mmol/L — ABNORMAL LOW (ref 3.5–5.1)
Sodium: 138 mmol/L (ref 135–145)
Total Bilirubin: 0.3 mg/dL (ref 0.3–1.2)
Total Protein: 6.8 g/dL (ref 6.5–8.1)

## 2020-07-11 LAB — URINALYSIS, ROUTINE W REFLEX MICROSCOPIC
Bilirubin Urine: NEGATIVE
Glucose, UA: NEGATIVE mg/dL
Ketones, ur: NEGATIVE mg/dL
Nitrite: NEGATIVE
Protein, ur: NEGATIVE mg/dL
Specific Gravity, Urine: 1.011 (ref 1.005–1.030)
pH: 5 (ref 5.0–8.0)

## 2020-07-11 LAB — CBC
HCT: 43.7 % (ref 36.0–46.0)
Hemoglobin: 13.9 g/dL (ref 12.0–15.0)
MCH: 24.6 pg — ABNORMAL LOW (ref 26.0–34.0)
MCHC: 31.8 g/dL (ref 30.0–36.0)
MCV: 77.3 fL — ABNORMAL LOW (ref 80.0–100.0)
Platelets: 327 10*3/uL (ref 150–400)
RBC: 5.65 MIL/uL — ABNORMAL HIGH (ref 3.87–5.11)
RDW: 15.9 % — ABNORMAL HIGH (ref 11.5–15.5)
WBC: 8 10*3/uL (ref 4.0–10.5)
nRBC: 0 % (ref 0.0–0.2)

## 2020-07-11 LAB — PROTEIN / CREATININE RATIO, URINE
Creatinine, Urine: 93.78 mg/dL
Total Protein, Urine: <6 mg/dL

## 2020-07-11 MED ORDER — ACETAMINOPHEN 500 MG PO TABS
1000.0000 mg | ORAL_TABLET | Freq: Four times a day (QID) | ORAL | Status: DC | PRN
  Administered 2020-07-11: 1000 mg via ORAL
  Filled 2020-07-11: qty 2

## 2020-07-11 MED ORDER — NIFEDIPINE ER OSMOTIC RELEASE 30 MG PO TB24
30.0000 mg | ORAL_TABLET | Freq: Every day | ORAL | 0 refills | Status: DC
Start: 2020-07-11 — End: 2020-07-17

## 2020-07-11 NOTE — MAU Provider Note (Signed)
History     CSN: [id number]  Arrival date and time: 07/11/20 1315   First Provider Initiated Contact with Patient 07/11/20 1433      Chief Complaint  Patient presents with  . BP Evaluation   34 y.o. [id number] s/p RCS 1 month ago sent from office for HTN. Denies visual disturbances, RUQ pain, SOB, and CP. Reports onset of mild HA since arrival to MAU. Rates pain 2-3/10. No hx of gHTN or CHTN. She is breastpumping. She reports getting enough rest.    OB History    Gravida  5   Para  2   Term  1   Preterm  1   AB  3   Living  3     SAB  2   TAB      Ectopic  1   Multiple  1   Live Births  2           Past Medical History:  Diagnosis Date  . AMA (advanced maternal age) multigravida 35+, third trimester   . Asthma    childhood  . Medical history non-contributory     Past Surgical History:  Procedure Laterality Date  . CESAREAN SECTION    . CESAREAN SECTION MULTI-GESTATIONAL N/A 06/09/2020   Procedure: CESAREAN SECTION MULTI-GESTATIONAL;  Surgeon: Edwinna Areola, DO;  Location: [facility] LD ORS;  Service: Obstetrics;  Laterality: N/A;  Tracey RNFA, 36 WEEKS  . DILATION AND CURETTAGE OF UTERUS      No family history on file.  Social History   Tobacco Use  . Smoking status: Never Smoker  . Smokeless tobacco: Never Used  Vaping Use  . Vaping Use: Never used  Substance Use Topics  . Alcohol use: Never  . Drug use: Never    Allergies:  Allergies  Allergen Reactions  . Penicillins Hives  . Tomato Hives    No medications prior to admission.    Review of Systems  Eyes: Negative for visual disturbance.  Gastrointestinal: Negative for abdominal pain.  Neurological: Positive for headaches.   Physical Exam   Blood pressure (!) 132/94, pulse 63, temperature 98.5 F (36.9 C), temperature source Oral, resp. rate 20, height 5\' 7"  (1.702 m), weight 85.8 kg, SpO2 99 %, unknown if currently breastfeeding. Patient Vitals for the past 24 hrs:  BP Temp  Temp src Pulse Resp SpO2 Height Weight  07/11/20 1622 (!) 132/94 -- -- -- -- -- -- --  07/11/20 1600 (!) 132/94 -- -- 63 -- -- -- --  07/11/20 1545 (!) 144/92 -- -- 62 -- -- -- --  07/11/20 1530 (!) 147/99 -- -- 71 -- -- -- --  07/11/20 1515 (!) 155/96 -- -- 72 -- -- -- --  07/11/20 1500 (!) 161/103 -- -- 82 -- -- -- --  07/11/20 1446 (!) 158/88 -- -- 69 -- -- -- --  07/11/20 1435 (!) 154/95 -- -- 71 -- -- -- --  07/11/20 1415 (!) 154/99 -- -- 67 -- -- -- --  07/11/20 1357 (!) 148/92 -- -- 67 -- -- -- --  07/11/20 1340 (!) 144/99 98.5 F (36.9 C) Oral 78 20 99 % -- --  07/11/20 1336 -- -- -- -- -- -- 5\' 7"  (1.702 m) 85.8 kg   Physical Exam Vitals and nursing note reviewed.  Constitutional:      General: She is not in acute distress.    Appearance: Normal appearance.  HENT:     Head: Normocephalic and atraumatic.  Cardiovascular:  Rate and Rhythm: Normal rate.  Pulmonary:     Effort: Pulmonary effort is normal. No respiratory distress.  Musculoskeletal:        General: No swelling.  Skin:    General: Skin is warm and dry.  Neurological:     General: No focal deficit present.     Mental Status: She is alert and oriented to person, place, and time.  Psychiatric:        Mood and Affect: Mood normal.    Results for orders placed or performed during the hospital encounter of 07/11/20 (from the past 24 hour(s))  Urinalysis, Routine w reflex microscopic Urine, Clean Catch     Status: Abnormal   Collection Time: 07/11/20  1:58 PM  Result Value Ref Range   Color, Urine YELLOW YELLOW   APPearance CLEAR CLEAR   Specific Gravity, Urine 1.011 1.005 - 1.030   pH 5.0 5.0 - 8.0   Glucose, UA NEGATIVE NEGATIVE mg/dL   Hgb urine dipstick MODERATE (A) NEGATIVE   Bilirubin Urine NEGATIVE NEGATIVE   Ketones, ur NEGATIVE NEGATIVE mg/dL   Protein, ur NEGATIVE NEGATIVE mg/dL   Nitrite NEGATIVE NEGATIVE   Leukocytes,Ua MODERATE (A) NEGATIVE   RBC / HPF 0-5 0 - 5 RBC/hpf   WBC, UA 0-5 0  - 5 WBC/hpf   Bacteria, UA RARE (A) NONE SEEN   Squamous Epithelial / LPF 0-5 0 - 5   Mucus PRESENT   CBC     Status: Abnormal   Collection Time: 07/11/20  2:46 PM  Result Value Ref Range   WBC 8.0 4.0 - 10.5 K/uL   RBC 5.65 (H) 3.87 - 5.11 MIL/uL   Hemoglobin 13.9 12.0 - 15.0 g/dL   HCT 50.9 36 - 46 %   MCV 77.3 (L) 80.0 - 100.0 fL   MCH 24.6 (L) 26.0 - 34.0 pg   MCHC 31.8 30.0 - 36.0 g/dL   RDW 32.6 (H) 71.2 - 45.8 %   Platelets 327 150 - 400 K/uL   nRBC 0.0 0.0 - 0.2 %  Comprehensive metabolic panel     Status: Abnormal   Collection Time: 07/11/20  2:46 PM  Result Value Ref Range   Sodium 138 135 - 145 mmol/L   Potassium 3.4 (L) 3.5 - 5.1 mmol/L   Chloride 104 98 - 111 mmol/L   CO2 23 22 - 32 mmol/L   Glucose, Bld 125 (H) 70 - 99 mg/dL   BUN 10 6 - 20 mg/dL   Creatinine, Ser 0.99 0.44 - 1.00 mg/dL   Calcium 9.4 8.9 - 83.3 mg/dL   Total Protein 6.8 6.5 - 8.1 g/dL   Albumin 3.5 3.5 - 5.0 g/dL   AST 20 15 - 41 U/L   ALT 18 0 - 44 U/L   Alkaline Phosphatase 58 38 - 126 U/L   Total Bilirubin 0.3 0.3 - 1.2 mg/dL   GFR calc non Af Amer >60 >60 mL/min   GFR calc Af Amer >60 >60 mL/min   Anion gap 11 5 - 15  Protein / creatinine ratio, urine     Status: None   Collection Time: 07/11/20  2:57 PM  Result Value Ref Range   Creatinine, Urine 93.78 mg/dL   Total Protein, Urine <6 mg/dL   Protein Creatinine Ratio        0.00 - 0.15 mg/mg[Cre]   MAU Course  Procedures Tylenol  MDM Labs ordered and reviewed. HA resolved. Labs normal. Consult with Dr. Donavan Foil, recommend start BP med  and office f/u in 2 days. Discussed plan with pt, agrees. Stable for discharge home.   Assessment and Plan   1. Postpartum hypertension    Discharge home Follow up at [facility] on 8/13- message left with Selena Batten Rx Procardia 30 XL PEC precautions  Allergies as of 07/11/2020      Reactions   Penicillins Hives   Tomato Hives      Medication List    STOP taking these medications   ibuprofen  600 MG tablet Commonly known as: ADVIL   oxyCODONE 5 MG immediate release tablet Commonly known as: Oxy IR/ROXICODONE     TAKE these medications   NIFEdipine 30 MG 24 hr tablet Commonly known as: Procardia XL Take 1 tablet (30 mg total) by mouth daily.   Prenatal 27-1 MG Tabs Take 1 tablet by mouth daily.   Vitamin D 50 MCG (2000 UT) tablet Take 2,000 Units by mouth daily.       Donette Larry, CNM 07/11/2020, 4:41 PM

## 2020-07-11 NOTE — Discharge Instructions (Signed)

## 2020-07-11 NOTE — MAU Note (Signed)
Sent from MD office for BP evaluation.  Denies H/A, visual disturbances, or epigastric pain.  Denies elevated BP's during pregnancy.  S/P Cesarean on 06/09/2020 (twin gestation).

## 2020-07-17 ENCOUNTER — Inpatient Hospital Stay (HOSPITAL_COMMUNITY)
Admission: AD | Admit: 2020-07-17 | Discharge: 2020-07-17 | Disposition: A | Discharge status: Home / Self Care | Payer: BC Managed Care – PPO | Attending: Obstetrics and Gynecology | Admitting: Obstetrics and Gynecology

## 2020-07-17 ENCOUNTER — Encounter (HOSPITAL_COMMUNITY): Payer: Self-pay | Admitting: Obstetrics and Gynecology

## 2020-07-17 ENCOUNTER — Other Ambulatory Visit: Payer: Self-pay

## 2020-07-17 DIAGNOSIS — R519 Headache, unspecified: Secondary | ICD-10-CM | POA: Insufficient documentation

## 2020-07-17 DIAGNOSIS — O165 Unspecified maternal hypertension, complicating the puerperium: Secondary | ICD-10-CM

## 2020-07-17 LAB — COMPREHENSIVE METABOLIC PANEL
ALT: 18 U/L (ref 0–44)
AST: 19 U/L (ref 15–41)
Albumin: 3.4 g/dL — ABNORMAL LOW (ref 3.5–5.0)
Alkaline Phosphatase: 56 U/L (ref 38–126)
Anion gap: 10 (ref 5–15)
BUN: 12 mg/dL (ref 6–20)
CO2: 24 mmol/L (ref 22–32)
Calcium: 9.3 mg/dL (ref 8.9–10.3)
Chloride: 104 mmol/L (ref 98–111)
Creatinine, Ser: 0.73 mg/dL (ref 0.44–1.00)
GFR calc Af Amer: >60 mL/min (ref >60)
GFR calc non Af Amer: >60 mL/min (ref >60)
Glucose, Bld: 81 mg/dL (ref 70–99)
Potassium: 3.7 mmol/L (ref 3.5–5.1)
Sodium: 138 mmol/L (ref 135–145)
Total Bilirubin: 0.4 mg/dL (ref 0.3–1.2)
Total Protein: 7.4 g/dL (ref 6.5–8.1)

## 2020-07-17 LAB — CBC
HCT: 44.3 % (ref 36.0–46.0)
Hemoglobin: 13.9 g/dL (ref 12.0–15.0)
MCH: 24.2 pg — ABNORMAL LOW (ref 26.0–34.0)
MCHC: 31.4 g/dL (ref 30.0–36.0)
MCV: 77.2 fL — ABNORMAL LOW (ref 80.0–100.0)
Platelets: 291 10*3/uL (ref 150–400)
RBC: 5.74 MIL/uL — ABNORMAL HIGH (ref 3.87–5.11)
RDW: 16 % — ABNORMAL HIGH (ref 11.5–15.5)
WBC: 8.8 10*3/uL (ref 4.0–10.5)
nRBC: 0 % (ref 0.0–0.2)

## 2020-07-17 LAB — URINALYSIS, ROUTINE W REFLEX MICROSCOPIC
Bilirubin Urine: NEGATIVE
Glucose, UA: NEGATIVE mg/dL
Ketones, ur: NEGATIVE mg/dL
Nitrite: NEGATIVE
Protein, ur: NEGATIVE mg/dL
RBC / HPF: >50 RBC/hpf — ABNORMAL HIGH (ref 0–5)
Specific Gravity, Urine: 1.015 (ref 1.005–1.030)
pH: 6 (ref 5.0–8.0)

## 2020-07-17 MED ORDER — CYCLOBENZAPRINE HCL 5 MG PO TABS
10.0000 mg | ORAL_TABLET | Freq: Once | ORAL | Status: DC
  Filled 2020-07-17: qty 2

## 2020-07-17 MED ORDER — NIFEDIPINE ER OSMOTIC RELEASE 30 MG PO TB24
60.0000 mg | ORAL_TABLET | Freq: Every day | ORAL | 0 refills | Status: AC
Start: 2020-07-17 — End: ?

## 2020-07-17 MED ORDER — KETOROLAC TROMETHAMINE 30 MG/ML IJ SOLN
30.0000 mg | Freq: Once | INTRAMUSCULAR | Status: AC
  Administered 2020-07-17: 30 mg via INTRAMUSCULAR
  Filled 2020-07-17: qty 1

## 2020-07-17 NOTE — MAU Provider Note (Signed)
History     CSN: [id number]  Arrival date and time: 07/17/20 1615   First Provider Initiated Contact with Patient 07/17/20 1652      Chief Complaint  Patient presents with  . BP Evaluation   Ms. Joanne Merritt is a 34 y.o. [phone] at ppartum who presents to MAU for preeclampsia evaluation after she was seen in the office today for a blood pressure check that was 160/108 in the office today and was sent over the MAU for evaluation. Patient reports BP at home is 122-132/90-102. Patient also endorses HA at this time, rating as 4/10. Patient reports taking Tylenol for HA today around 1PM and took 2 extra strength Tylenol, which worked for a little bit, but the headache came back about 315PM. Pt denies previous HA diagnoses. Patient denies cHTN, but does report issues with blood pressure later in pregnancy. Patient had RCS on 06/09/2020.  Pt denies blurry vision/seeing spots, N/V, epigastric pain, swelling in face and hands, sudden weight gain. Pt denies chest pain and SOB.  Pt denies constipation, diarrhea, or urinary problems. Pt denies fever, chills, fatigue, sweating or changes in appetite. Pt denies dizziness, light-headedness, weakness.  Blood Type? B Positive Allergies? PCN, tomato Current medications? PNVs, VitaminD, Procardia 30mg  XL Current PNC & next appt? [facility], 07/26/2020   OB History    Gravida  5   Para  2   Term  1   Preterm  1   AB  3   Living  3     SAB  2   TAB      Ectopic  1   Multiple  1   Live Births  2           Past Medical History:  Diagnosis Date  . AMA (advanced maternal age) multigravida 35+, third trimester   . Asthma    childhood  . Medical history non-contributory     Past Surgical History:  Procedure Laterality Date  . CESAREAN SECTION    . CESAREAN SECTION MULTI-GESTATIONAL N/A 06/09/2020   Procedure: CESAREAN SECTION MULTI-GESTATIONAL;  Surgeon: 08/10/2020, DO;  Location: [facility];  Service:  Obstetrics;  Laterality: N/A;  Tracey RNFA, 36 WEEKS  . DILATION AND CURETTAGE OF UTERUS      No family history on file.  Social History   Tobacco Use  . Smoking status: Never Smoker  . Smokeless tobacco: Never Used  Vaping Use  . Vaping Use: Never used  Substance Use Topics  . Alcohol use: Never  . Drug use: Never    Allergies:  Allergies  Allergen Reactions  . Penicillins Hives  . Tomato Hives    Medications Prior to Admission  Medication Sig Dispense Refill Last Dose  . acetaminophen (TYLENOL) 500 MG tablet Take 500 mg by mouth every 6 (six) hours as needed for headache.     . Cholecalciferol (VITAMIN D) 50 MCG (2000 UT) tablet Take 2,000 Units by mouth daily.   07/17/2020 at Unknown time  . Prenatal 27-1 MG TABS Take 1 tablet by mouth daily. 100 tablet 3 07/17/2020 at Unknown time  . [DISCONTINUED] NIFEdipine (PROCARDIA XL) 30 MG 24 hr tablet Take 1 tablet (30 mg total) by mouth daily. 30 tablet 0 07/17/2020 at Unknown time    Review of Systems  Constitutional: Negative for chills, diaphoresis, fatigue and fever.  Eyes: Negative for visual disturbance.  Respiratory: Negative for shortness of breath.   Cardiovascular: Negative for chest pain.  Gastrointestinal: Negative for abdominal  pain, constipation, diarrhea, nausea and vomiting.  Genitourinary: Negative for dysuria, flank pain, frequency, pelvic pain, urgency, vaginal bleeding and vaginal discharge.  Neurological: Positive for headaches. Negative for dizziness, weakness and light-headedness.   Physical Exam   Blood pressure 137/85, pulse 78, temperature 98.4 F (36.9 C), temperature source Oral, resp. rate 18, height 5\' 7"  (1.702 m), weight 85.9 kg, SpO2 100 %, unknown if currently breastfeeding.  Patient Vitals for the past 24 hrs:  BP Temp Temp src Pulse Resp SpO2 Height Weight  07/17/20 1831 137/85 -- -- 78 -- -- -- --  07/17/20 1816 132/88 -- -- 80 -- -- -- --  07/17/20 1801 (!) 141/89 -- -- 79 -- -- -- --   07/17/20 1746 136/90 -- -- 81 -- -- -- --  07/17/20 1731 131/89 -- -- 76 -- -- -- --  07/17/20 1711 (!) 131/92 -- -- 82 -- -- -- --  07/17/20 1643 (!) 133/92 98.4 F (36.9 C) Oral 85 18 100 % -- --  07/17/20 1639 -- -- -- -- -- -- 5\' 7"  (1.702 m) 85.9 kg   Physical Exam Vitals and nursing note reviewed.  Constitutional:      General: She is not in acute distress.    Appearance: Normal appearance. She is not ill-appearing, toxic-appearing or diaphoretic.  HENT:     Head: Normocephalic and atraumatic.  Pulmonary:     Effort: Pulmonary effort is normal.  Neurological:     Mental Status: She is alert and oriented to person, place, and time.  Psychiatric:        Mood and Affect: Mood normal.        Behavior: Behavior normal.        Thought Content: Thought content normal.        Judgment: Judgment normal.    Results for orders placed or performed during the hospital encounter of 07/17/20 (from the past 24 hour(s))  Urinalysis, Routine w reflex microscopic Urine, Clean Catch     Status: Abnormal   Collection Time: 07/17/20  4:55 PM  Result Value Ref Range   Color, Urine YELLOW YELLOW   APPearance HAZY (A) CLEAR   Specific Gravity, Urine 1.015 1.005 - 1.030   pH 6.0 5.0 - 8.0   Glucose, UA NEGATIVE NEGATIVE mg/dL   Hgb urine dipstick LARGE (A) NEGATIVE   Bilirubin Urine NEGATIVE NEGATIVE   Ketones, ur NEGATIVE NEGATIVE mg/dL   Protein, ur NEGATIVE NEGATIVE mg/dL   Nitrite NEGATIVE NEGATIVE   Leukocytes,Ua SMALL (A) NEGATIVE   RBC / HPF >50 (H) 0 - 5 RBC/hpf   WBC, UA 6-10 0 - 5 WBC/hpf   Bacteria, UA RARE (A) NONE SEEN   Squamous Epithelial / LPF 6-10 0 - 5   Mucus PRESENT   CBC     Status: Abnormal   Collection Time: 07/17/20  5:28 PM  Result Value Ref Range   WBC 8.8 4.0 - 10.5 K/uL   RBC 5.74 (H) 3.87 - 5.11 MIL/uL   Hemoglobin 13.9 12.0 - 15.0 g/dL   HCT 16.144.3 36 - 46 %   MCV 77.2 (L) 80.0 - 100.0 fL   MCH 24.2 (L) 26.0 - 34.0 pg   MCHC 31.4 30.0 - 36.0 g/dL   RDW  09.616.0 (H) 04.511.5 - 15.5 %   Platelets 291 150 - 400 K/uL   nRBC 0.0 0.0 - 0.2 %  Comprehensive metabolic panel     Status: Abnormal   Collection Time: 07/17/20  5:28 PM  Result  Value Ref Range   Sodium 138 135 - 145 mmol/L   Potassium 3.7 3.5 - 5.1 mmol/L   Chloride 104 98 - 111 mmol/L   CO2 24 22 - 32 mmol/L   Glucose, Bld 81 70 - 99 mg/dL   BUN 12 6 - 20 mg/dL   Creatinine, Ser 8.92 0.44 - 1.00 mg/dL   Calcium 9.3 8.9 - 11.9 mg/dL   Total Protein 7.4 6.5 - 8.1 g/dL   Albumin 3.4 (L) 3.5 - 5.0 g/dL   AST 19 15 - 41 U/L   ALT 18 0 - 44 U/L   Alkaline Phosphatase 56 38 - 126 U/L   Total Bilirubin 0.4 0.3 - 1.2 mg/dL   GFR calc non Af Amer >60 >60 mL/min   GFR calc Af Amer >60 >60 mL/min   Anion gap 10 5 - 15    No results found.  MAU Course  Procedures  MDM -preeclampsia evaluation without severe range BP in MAU on admission -elevated BP of 130s/90s -symptoms include: HA; Toradol 30mg  IM given -UA: hazy/lg hgb/sm leuks/rare bacteria, sending urine for culture -CBC: H/H 13.9/44.3, platelets 291 -CMP: serum creatinine 0.73, AST/ALT 19/18 -after medication administration, pt reports HA now 2/10 -consulted with Dr. 4/10, will change to 60mg  Procardia XL with BP check in office in 1-2 days, pt OK to be discharged home -pt discharged to home in stable condition  Orders Placed This Encounter  Procedures  . Urinalysis, Routine w reflex microscopic Urine, Clean Catch    Standing Status:   Standing    Number of Occurrences:   1  . CBC    Standing Status:   Standing    Number of Occurrences:   1  . Comprehensive metabolic panel    Standing Status:   Standing    Number of Occurrences:   1  . Discharge patient    Order Specific Question:   Discharge disposition    Answer:   01-Home or Self Care [1]    Order Specific Question:   Discharge patient date    Answer:   07/17/2020    Meds ordered this encounter  Medications  . DISCONTD: cyclobenzaprine (FLEXERIL) tablet 10 mg   . ketorolac (TORADOL) 30 MG/ML injection 30 mg  . NIFEdipine (PROCARDIA XL) 30 MG 24 hr tablet    Sig: Take 2 tablets (60 mg total) by mouth daily.    Dispense:  30 tablet    Refill:  0    Order Specific Question:   Supervising Provider    Answer:   [[id number]]    Assessment and Plan   1. Postpartum hypertension   2. Nonintractable headache, unspecified chronicity pattern, unspecified headache type     Allergies as of 07/17/2020      Reactions   Penicillins Hives   Tomato Hives      Medication List    TAKE these medications   acetaminophen 500 MG tablet Commonly known as: TYLENOL Take 500 mg by mouth every 6 (six) hours as needed for headache.   NIFEdipine 30 MG 24 hr tablet Commonly known as: Procardia XL Take 2 tablets (60 mg total) by mouth daily. What changed: how much to take   Prenatal 27-1 MG Tabs Take 1 tablet by mouth daily.   Vitamin D 50 MCG (2000 UT) tablet Take 2,000 Units by mouth daily.       -referral to PCP for cHTN -discussed 6 week ppartum date and patient advised to present to  ED after Saturday with any further concerns -called and spoke with Dr. Senaida Ores to alert to med change and BP check needed in office in 1-2 days; per Dr. Senaida Ores she will send message to office and patient should be instructed to call office tomorrow for appointment -Reviewed warning blood pressure values (systolic = / > 140 and/or diastolic =/> 90). Explained that, if blood pressure is elevated, she should sit down, rest, and eat/drink something. If still elevated 15 minutes later, she should call clinic or come to MAU. She should come to MAU if she has elevated pressures and any of the following:  - headache not relieved with tylenol, rest, hydration -blurry vision, floating spots in her vision - sudden full-body edema or facial edema -RUQ pain that is constant. These symptoms may indicate that her blood pressure is worsening and she may be developing  pre-eclampsia, which is an emergency.  -return MAU precautions given -pt discharged to home in stable condition  Odie Sera Caswell Alvillar 07/17/2020, 6:55 PM

## 2020-07-17 NOTE — Discharge Instructions (Signed)
Postpartum Hypertension Postpartum hypertension is high blood pressure that remains higher than normal after childbirth. You may not realize that you have postpartum hypertension if your blood pressure is not being checked regularly. In most cases, postpartum hypertension will go away on its own, usually within a week of delivery. However, for some women, medical treatment is required to prevent serious complications, such as seizures or stroke. What are the causes? This condition may be caused by one or more of the following:  Hypertension that existed before pregnancy (chronic hypertension).  Hypertension that comes on as a result of pregnancy (gestational hypertension).  Hypertensive disorders during pregnancy (preeclampsia) or seizures in women who have high blood pressure during pregnancy (eclampsia).  A condition in which the liver, platelets, and red blood cells are damaged during pregnancy (HELLP syndrome).  A condition in which the thyroid produces too much hormones (hyperthyroidism).  Other rare problems of the nerves (neurological disorders) or blood disorders. In some cases, the cause may not be known. What increases the risk? The following factors may make you more likely to develop this condition:  Chronic hypertension. In some cases, this may not have been diagnosed before pregnancy.  Obesity.  Type 2 diabetes.  Kidney disease.  History of preeclampsia or eclampsia.  Other medical conditions that change the level of hormones in the body (hormonal imbalance). What are the signs or symptoms? As with all types of hypertension, postpartum hypertension may not have any symptoms. Depending on how high your blood pressure is, you may experience:  Headaches. These may be mild, moderate, or severe. They may also be steady, constant, or sudden in onset (thunderclap headache).  Changes in your ability to see (visual changes).  Dizziness.  Shortness of breath.  Swelling  of your hands, feet, lower legs, or face. In some cases, you may have swelling in more than one of these locations.  Heart palpitations or a racing heartbeat.  Difficulty breathing while lying down.  Decrease in the amount of urine that you pass. Other rare signs and symptoms may include:  Sweating more than usual. This lasts longer than a few days after delivery.  Chest pain.  Sudden dizziness when you get up from sitting or lying down.  Seizures.  Nausea or vomiting.  Abdominal pain. How is this diagnosed? This condition may be diagnosed based on the results of a physical exam, blood pressure measurements, and blood and urine tests. You may also have other tests, such as a CT scan or an MRI, to check for other problems of postpartum hypertension. How is this treated? If blood pressure is high enough to require treatment, your options may include:  Medicines to reduce blood pressure (antihypertensives). Tell your health care provider if you are breastfeeding or if you plan to breastfeed. There are many antihypertensive medicines that are safe to take while breastfeeding.  Stopping medicines that may be causing hypertension.  Treating medical conditions that are causing hypertension.  Treating the complications of hypertension, such as seizures, stroke, or kidney problems. Your health care provider will also continue to monitor your blood pressure closely until it is within a safe range for you. Follow these instructions at home:  Take over-the-counter and prescription medicines only as told by your health care provider.  Return to your normal activities as told by your health care provider. Ask your health care provider what activities are safe for you.  Do not use any products that contain nicotine or tobacco, such as cigarettes and e-cigarettes. If   you need help quitting, ask your health care provider.  Keep all follow-up visits as told by your health care provider. This  is important. Contact a health care provider if:  Your symptoms get worse.  You have new symptoms, such as: ? A headache that does not get better. ? Dizziness. ? Visual changes. Get help right away if:  You suddenly develop swelling in your hands, ankles, or face.  You have sudden, rapid weight gain.  You develop difficulty breathing, chest pain, racing heartbeat, or heart palpitations.  You develop severe pain in your abdomen.  You have any symptoms of a stroke. "BE FAST" is an easy way to remember the main warning signs of a stroke: ? B - Balance. Signs are dizziness, sudden trouble walking, or loss of balance. ? E - Eyes. Signs are trouble seeing or a sudden change in vision. ? F - Face. Signs are sudden weakness or numbness of the face, or the face or eyelid drooping on one side. ? A - Arms. Signs are weakness or numbness in an arm. This happens suddenly and usually on one side of the body. ? S - Speech. Signs are sudden trouble speaking, slurred speech, or trouble understanding what people say. ? T - Time. Time to call emergency services. Write down what time symptoms started.  You have other signs of a stroke, such as: ? A sudden, severe headache with no known cause. ? Nausea or vomiting. ? Seizure. These symptoms may represent a serious problem that is an emergency. Do not wait to see if the symptoms will go away. Get medical help right away. Call your local emergency services (911 in the [place] Do not drive yourself to the hospital. Summary  Postpartum hypertension is high blood pressure that remains higher than normal after childbirth.  In most cases, postpartum hypertension will go away on its own, usually within a week of delivery.  For some women, medical treatment is required to prevent serious complications, such as seizures or stroke. This information is not intended to replace advice given to you by your health care provider. Make sure you discuss any questions  you have with your health care provider. Document Revised: 12/24/2018 Document Reviewed: 09/07/2017 Elsevier Patient Education  2020 Elsevier Inc.        AREA FAMILY PRACTICE PHYSICIANS  [place] ([id number])  [facility] o [address] [place] [place], [place] [address] o [phone] o Mon-Fri 8:30-12:30, 1:30-5:00 o Accepting [facility] Family Medicine at [facility] [address], [place], [place] [address] o [phone] o Mon-Fri 8:00-5:30  [facility] o [address] [place], [place] [address] o [phone], Tue, Thur, Fri 8:30-5:00, Wed 10:00-7:00 (closed 1-2pm) o Accepting Medicaid  [facility] o [address], [place], [place]  [address] o Phone - [phone]   Fax - [phone]  [place] [phone])  [facility] o [address], [place], [place] [address] o [phone] o Mon-Fri 8:00-5:00  [facility] Pediatrics at [facility])  o [address] [place] [place], [place] [address] o [phone] o Mon-Fri 8:30-5:30, Sat (Oct.-Mar.) 9:00-1:00 o Accepting Medicaid  [place] [phone])  [facility] at [facility] o [address] [place], [place] [address] o [phone] o Mon-Fri 8:00-5:00  [place] [phone])  [facility] Medicine at [facility] o [address] [place], [place] [address] o [phone] o Mon-Fri 8:00-5:00  [facility] [place] o [address] [place], [place] [address] o [phone] o Mon-Fri 8:00-5:00  [facility] o [address] [place], [place] [address] o [phone] o Mon-Fri 8:00-5:00  [facility] o [address] [place] [place] [address] o [phone] o Mon-Fri 7:30-5:30  [place] [phone] & [phone])  [facility] o [address], [place], [place] [address] o [phone] o Mon-Thur 8:00-6:00 o Accepting Medicaid  [facility] o [address] [place], [place] [address] o [phone] o Mon-Thur 7:30-7:30, Fri 7:30-4:30 o Accepting [facility] at [facility] o [address], [place], [place]  [address] o [phone]   Fax - [phone]  [place] [phone] & [phone])  Nature conservation officer at [facility] o [address] [place], [place] [address] o [phone] o Mon-Fri 7:00-5:00  [facility] Family Medicine o [address], [place], [place] [address] o [phone] o Mon-Fri 8:00-5:00 o Accepting Medicaid  [facility] - [facility] o [address] [place], [place] [address] o [phone] o Mon-Fri 8:00-5:00 o Accepting Medicaid  [place] [phone])  [facility] at [facility] o [address] [place], [place] [address] o [phone] o Mon-Fri 8:00-5:00  [facility] - [facility] at [employer]) o [address], [place], [place] [address] o [phone] o Mon-Fri 8:00-5:00 o Accepting Medicaid  [facility] [employer]) o [address], [place], [place] [address] o [phone] o Mon-Fri 8:00-5:30, Sat&Sun by appointment (phones open at 8:30) o Accepting [facility] [phone] & [phone])  [facility] Medicine o [address] [place], [place] [address] o [phone] o Mon-Thur 8:00-7:00, Fri 8:00-5:00, Sat 8:00-12:00, Sun 9:00-12:00 o Accepting Medicaid  [facility] Family Medicine at [facility] [address], [place], [place] [address] o [phone] o Mon-Thur 8:00-5:00 o Accepting Medicaid  [facility] - Family  Medicine at [place] o [address] [place] [place], [place] [address] o [phone] o Mon-Fri 8:00-5:30, Sat (Oct.-Mar.) 9:00-1:00 o Accepting Medicaid  [place] [phone])  [facility] o [address] [place], [place] [address] o [phone] o Mon-Fri 8:00-5:00 o Accepting Medicaid   [place] [phone])  [facility] at [facility] o [address], [place], [place] [address] o [phone] o Mon-Fri 8:00-5:00  [facility] [place] o [address], [place], [place] [address] o [phone] o Mon-Fri 8:00-5:00  [facility] [place] o [address], [place], [place] [address] o [phone] o Mon-Fri 8:00-5:00 o After hours clinic [facility] [place], [place] [address]) [phone] Mon-Fri 5:00-8:00, Sat 12:00-6:00, Sun 10:00-4:00 o Accepting Medicaid  [facility] at [facility] o [address] [place], [place]  [address] o [phone]   Fax - [phone]  [place] [phone])  Adult nurse HealthCare at [facility] o [address] [place], [place], [place] [address] o [phone] o Mon-Fri 8:00-5:00  [facility] Family Medicine - [place] [facility] at [place]) o [address] [place], [place] [address] o [phone] o Mon-Thur 8:00-7:00, Fri 8:00-5:00, Sat 8:00-12:00

## 2020-07-17 NOTE — MAU Note (Signed)
Sent from MD office for BP evaluation.  Reports HA, denies epigastric pain and visual disturbances.  S/P Cesarean delivery 06/09/2020.

## 2020-07-25 ENCOUNTER — Encounter (HOSPITAL_COMMUNITY): Payer: Self-pay | Admitting: Obstetrics and Gynecology

## 2020-08-02 ENCOUNTER — Other Ambulatory Visit: Payer: Self-pay | Admitting: Certified Nurse Midwife

## 2022-03-09 IMAGING — US US MFM FETAL BPP W/ NONSTRESS ADDL GEST
1 series · 14 of 28 positions shown · non-contrast
Comparison: none

[Series 1: us mfm fetal bpp w/ nonstress addl gest · 30 acquisitions, 14 frames shown]
[im 2/30]
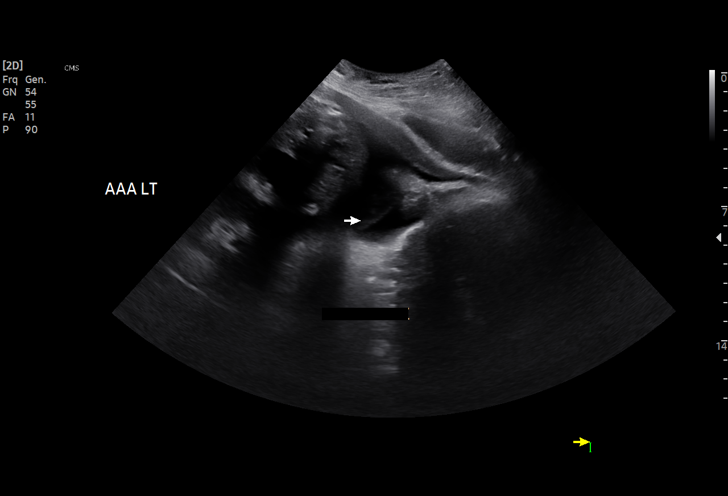
[im 4/30]
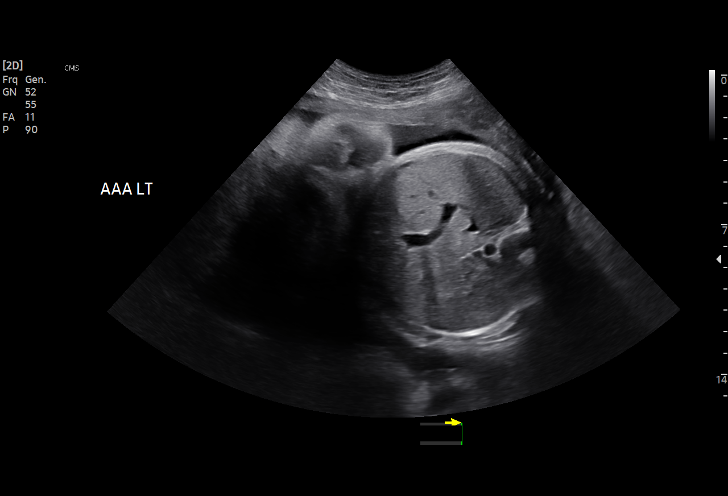
[im 6/30]
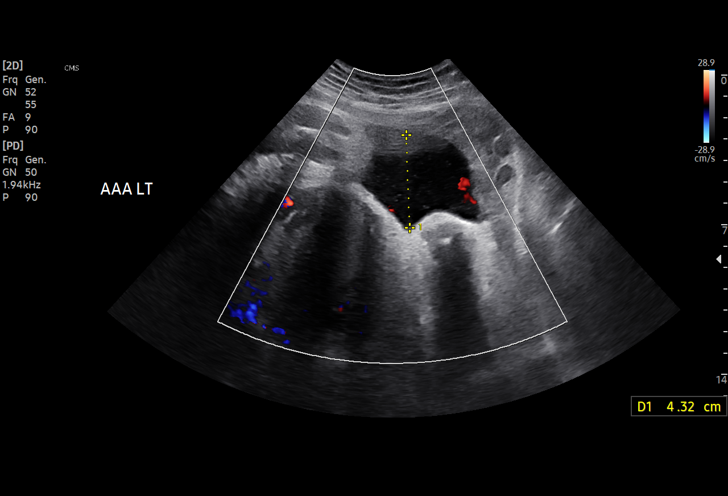
[im 8/30]
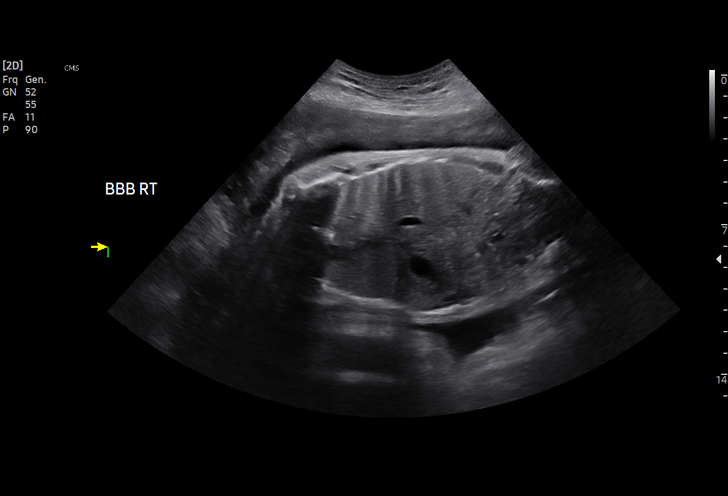
[im 10/30]
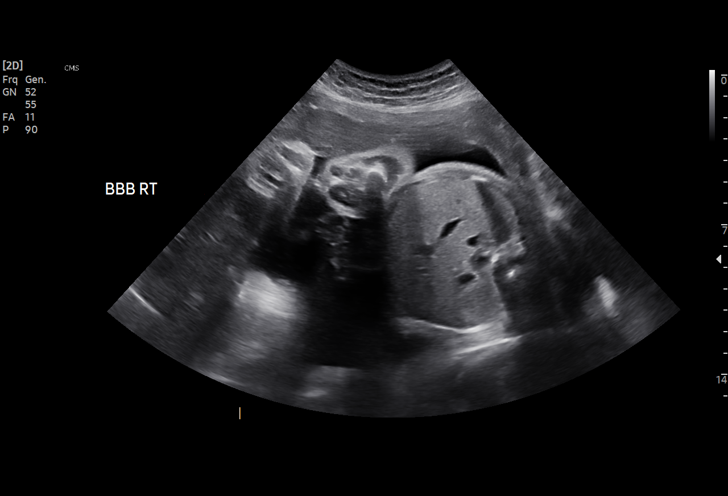
[im 12/30]
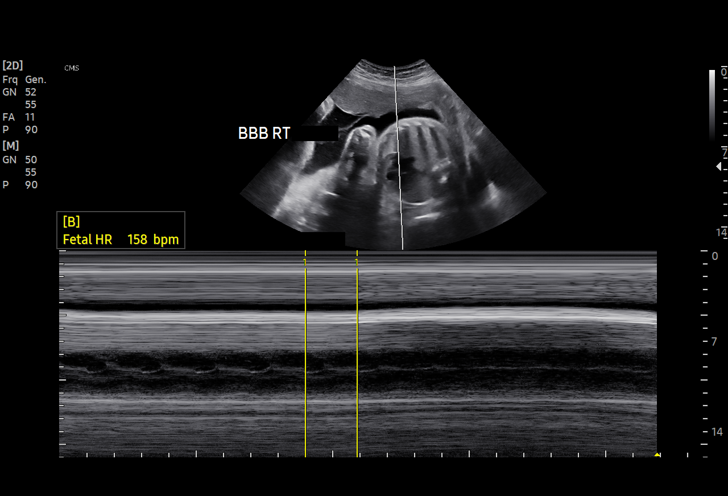
[im 14/30]
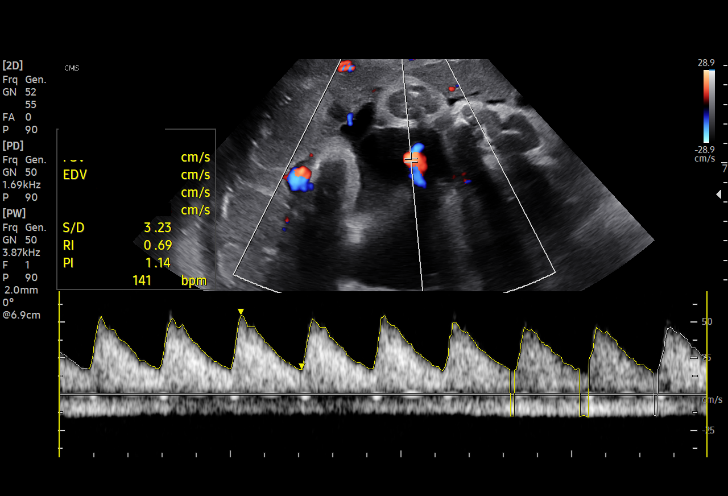
[im 17/30]
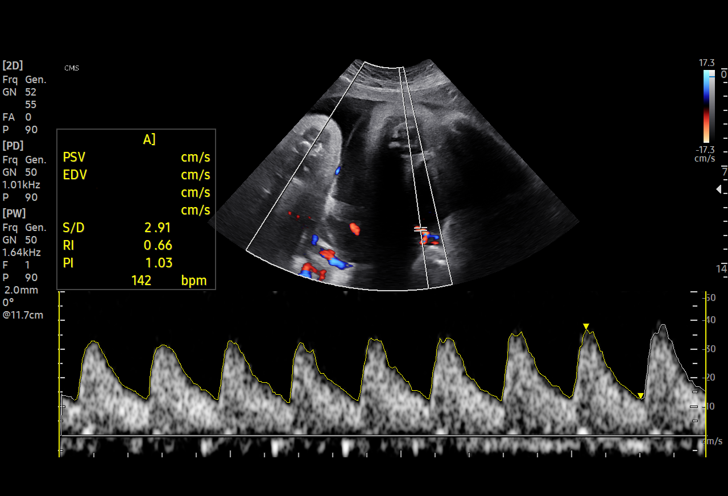
[im 19/30]
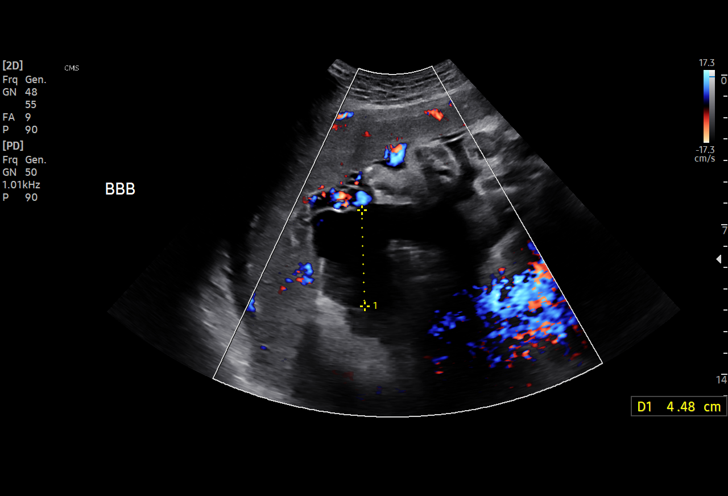
[im 21/30]
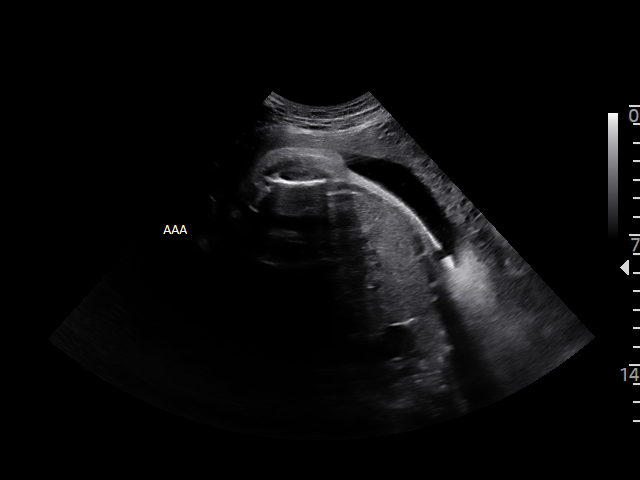
[im 23/30]
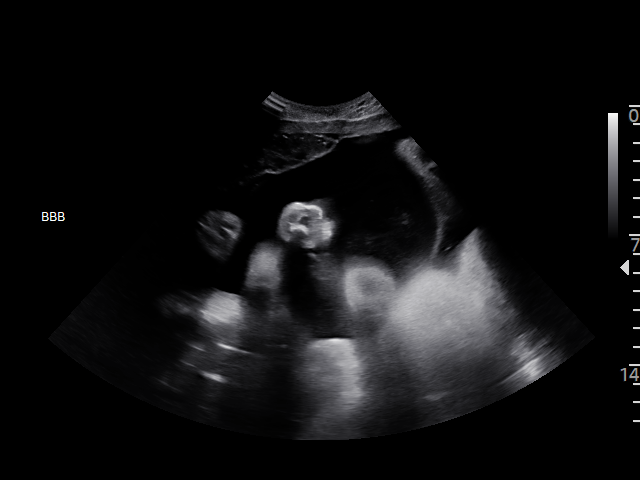
[im 25/30]
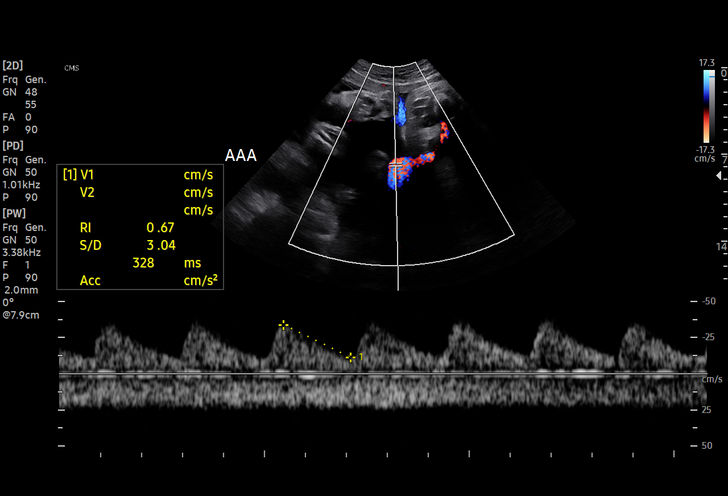
[im 27/30]
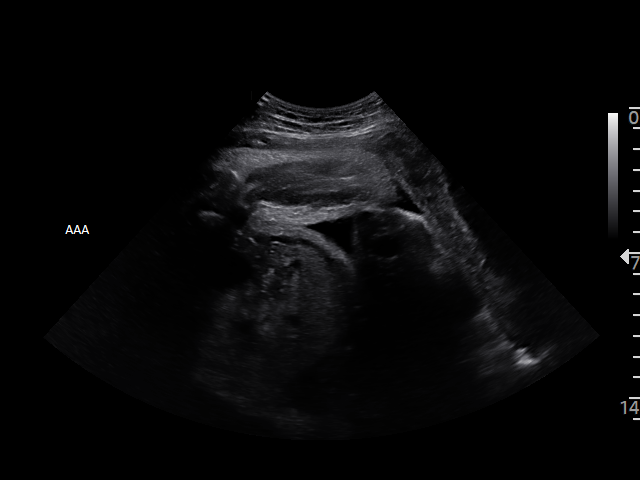
[im 30/30]
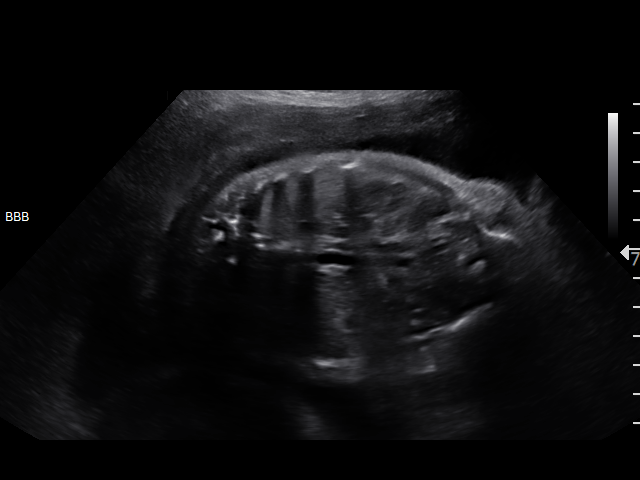

[14 of 28 positions shown; findings below may reference images not displayed]

#101

    W/NONSTRESS                                       OGOSE
 2  US MFM UA CORD DOPPLER                76820.02    APREKU
                                                      OGOSE
                                                      OGOSE
    MOATSHE/NONSTRESS MIO ESLAVA                            OGOSE

Indications

 Maternal care for known or suspected poor
 fetal growth, third trimester, fetus 2 IUGR
 Encounter for other antenatal screening
 follow-up
 Twin pregnancy, di/di, third trimester
 Advanced maternal age multigravida 35+,
 third trimester
 Polyhydramnios, third trimester, antepartum
 condition or complication, fetus 1
 Obesity complicating pregnancy, third
 trimester
 Previous cesarean delivery, antepartum x 1
 Declined genetic testing
 35 weeks gestation of pregnancy
Fetal Evaluation (Fetus A)

 Num Of Fetuses:         2
 Fetal Heart Rate(bpm):  138
 Cardiac Activity:       Observed
 Fetal Lie:              Maternal left side
 Presentation:           Cephalic
 Placenta:               Anterior
 Amniotic Fluid
 AFI FV:      Within normal limits

                             Largest Pocket(cm)

Biophysical Evaluation (Fetus A)

 Amniotic F.V:   Within normal limits       F. Tone:        Observed
 F. Movement:    Observed                   N.S.T:          Reactive
 F. Breathing:   Observed                   Score:          [DATE]
OB History

 Gravidity:    5         Term:   1        Prem:   0        SAB:   2
 TOP:          0       Ectopic:  1        Living: 1
Gestational Age (Fetus A)

 LMP:           35w 3d        Date:  10/04/19                 EDD:   07/10/20
 Best:          35w 3d     Det. By:  LMP  (10/04/19)          EDD:   07/10/20
Doppler - Fetal Vessels (Fetus A)

 Umbilical Artery
  S/D     %tile      RI    %tile      PI    %tile     PSV    ADFV    RDFV
                                                    (cm/s)
  2.65       63    0.62       68    0.[REDACTED]      No      No

Fetal Evaluation (Fetus B)

 Num Of Fetuses:         2
 Fetal Heart Rate(bpm):  158
 Cardiac Activity:       Observed
 Fetal Lie:              Maternal right side
 Presentation:           Transverse, head to maternal right
 Placenta:               Anterior Fundal

 Amniotic Fluid
 AFI FV:      Within normal limits

                             Largest Pocket(cm)

Biophysical Evaluation (Fetus B)

 Amniotic F.V:   Within normal limits       F. Tone:        Observed
 F. Movement:    Observed                   N.S.T:          Reactive
 F. Breathing:   Observed                   Score:          [DATE]
Gestational Age (Fetus B)

 LMP:           35w 3d        Date:  10/04/19                 EDD:   07/10/20
 Best:          35w 3d     Det. By:  LMP  (10/04/19)          EDD:   07/10/20
Doppler - Fetal Vessels (Fetus B)
 Umbilical Artery
  S/D     %tile      RI    %tile      PI    %tile     PSV    ADFV    RDFV
                                                    (cm/s)
  2.65       63    0.62       68    0.[REDACTED]      No      No

Comments

 This patient was seen due to IUGR of twin B in a dichorionic,
 diamniotic twin gestation.  She denies any problems since
 her last exam and reports feeling vigorous fetal movements
 x2.
 Doppler studies of the umbilical arteries performed for both
 twin A and twin B continues to show normal forward flow.
 There were no signs of absent or reversed end-diastolic flow
 noted in either fetus today.
 The amniotic fluid level appeared within normal limits for both
 fetuses.
 A biophysical profile performed today was [DATE] for both
 twin A and twin B.
 The patient also had a reactive nonstress test for both twin A
 and twin B today, making her biophysical profile [DATE].
 There were no decelerations noted on today's nonstress test.
 The patient reports that she already has a cesarean delivery
 scheduled on [DATE] at around 36 weeks.  She is scheduled
 to receive a complete course of antenatal corticosteroids
 prior to delivery.

## 2024-02-11 ENCOUNTER — Other Ambulatory Visit: Payer: Self-pay | Admitting: Obstetrics and Gynecology

## 2024-02-11 DIAGNOSIS — R928 Other abnormal and inconclusive findings on diagnostic imaging of breast: Secondary | ICD-10-CM

## 2024-02-25 ENCOUNTER — Ambulatory Visit
Admission: RE | Admit: 2024-02-25 | Discharge: 2024-02-25 | Disposition: A | Discharge status: Home / Self Care | Source: Ambulatory Visit | Attending: Obstetrics and Gynecology | Admitting: Obstetrics and Gynecology

## 2024-02-25 ENCOUNTER — Other Ambulatory Visit: Payer: Self-pay | Admitting: Obstetrics and Gynecology

## 2024-02-25 DIAGNOSIS — R928 Other abnormal and inconclusive findings on diagnostic imaging of breast: Secondary | ICD-10-CM

## 2024-02-25 DIAGNOSIS — N6489 Other specified disorders of breast: Secondary | ICD-10-CM

## 2024-08-29 ENCOUNTER — Ambulatory Visit
Admission: RE | Admit: 2024-08-29 | Discharge: 2024-08-29 | Disposition: A | Source: Ambulatory Visit | Attending: Obstetrics and Gynecology

## 2024-08-29 DIAGNOSIS — N6489 Other specified disorders of breast: Secondary | ICD-10-CM

## 2024-08-29 DIAGNOSIS — R928 Other abnormal and inconclusive findings on diagnostic imaging of breast: Secondary | ICD-10-CM

## 2024-08-30 ENCOUNTER — Other Ambulatory Visit: Payer: Self-pay | Admitting: Obstetrics and Gynecology

## 2024-08-30 DIAGNOSIS — N6489 Other specified disorders of breast: Secondary | ICD-10-CM

## 2025-02-27 ENCOUNTER — Encounter
